# Patient Record
Sex: Female | Born: 1970
Health system: Southern US, Community
[De-identification: ages and names within clinical notes are randomized; demographics above are authoritative.]

## PROBLEM LIST (undated history)

## (undated) DIAGNOSIS — K8020 Calculus of gallbladder without cholecystitis without obstruction: Secondary | ICD-10-CM

## (undated) DIAGNOSIS — N920 Excessive and frequent menstruation with regular cycle: Secondary | ICD-10-CM

## (undated) HISTORY — DX: Excessive and frequent menstruation with regular cycle: N92.0

## (undated) HISTORY — PX: CHOLECYSTECTOMY: SHX55

## (undated) HISTORY — PX: LAPAROSCOPIC GASTRIC SLEEVE RESECTION: SHX5895

## (undated) HISTORY — PX: ENDOMETRIAL ABLATION: SHX621

## (undated) HISTORY — DX: Calculus of gallbladder without cholecystitis without obstruction: K80.20

## (undated) HISTORY — PX: ABDOMINAL HYSTERECTOMY: SHX81

---

## 2006-01-27 ENCOUNTER — Emergency Department: Payer: Self-pay | Admitting: Emergency Medicine

## 2006-01-27 ENCOUNTER — Other Ambulatory Visit: Payer: Self-pay

## 2008-04-18 ENCOUNTER — Emergency Department: Payer: Self-pay | Admitting: Emergency Medicine

## 2008-11-05 ENCOUNTER — Ambulatory Visit: Payer: Self-pay

## 2011-03-15 ENCOUNTER — Ambulatory Visit: Payer: Self-pay

## 2011-10-19 ENCOUNTER — Ambulatory Visit: Payer: Self-pay | Admitting: Bariatrics

## 2011-10-19 LAB — COMPREHENSIVE METABOLIC PANEL
Albumin: 3.8 g/dL (ref 3.4–5.0)
Alkaline Phosphatase: 73 U/L (ref 50–136)
Anion Gap: 9 (ref 7–16)
BUN: 11 mg/dL (ref 7–18)
Bilirubin,Total: 0.3 mg/dL (ref 0.2–1.0)
Calcium, Total: 9.2 mg/dL (ref 8.5–10.1)
Chloride: 105 mmol/L (ref 98–107)
Co2: 27 mmol/L (ref 21–32)
EGFR (Non-African Amer.): >60
Glucose: 94 mg/dL (ref 65–99)
Osmolality: 280 (ref 275–301)
Potassium: 4 mmol/L (ref 3.5–5.1)
SGOT(AST): 25 U/L (ref 15–37)
SGPT (ALT): 36 U/L
Sodium: 141 mmol/L (ref 136–145)

## 2011-10-19 LAB — CBC WITH DIFFERENTIAL/PLATELET
Basophil %: 0.9 %
Eosinophil #: 0.3 10*3/uL (ref 0.0–0.7)
HGB: 13.8 g/dL (ref 12.0–16.0)
Lymphocyte #: 1.8 10*3/uL (ref 1.0–3.6)
MCH: 28.3 pg (ref 26.0–34.0)
MCHC: 33.5 g/dL (ref 32.0–36.0)
Monocyte #: 0.6 x10 3/mm (ref 0.2–0.9)
Monocyte %: 10.2 %
Neutrophil #: 2.9 10*3/uL (ref 1.4–6.5)
RBC: 4.89 10*6/uL (ref 3.80–5.20)
WBC: 5.5 10*3/uL (ref 3.6–11.0)

## 2011-10-19 LAB — IRON AND TIBC
Iron Bind.Cap.(Total): 400 ug/dL (ref 250–450)
Iron Saturation: 11 %
Iron: 43 ug/dL — ABNORMAL LOW (ref 50–170)
Unbound Iron-Bind.Cap.: 357 ug/dL

## 2011-10-19 LAB — MAGNESIUM: Magnesium: 1.9 mg/dL

## 2011-10-19 LAB — LIPASE, BLOOD: Lipase: 246 U/L (ref 73–393)

## 2011-10-19 LAB — PROTIME-INR: INR: 0.9

## 2011-10-19 LAB — AMYLASE: Amylase: 42 U/L (ref 25–115)

## 2011-12-02 ENCOUNTER — Ambulatory Visit: Payer: Self-pay | Admitting: Bariatrics

## 2011-12-08 ENCOUNTER — Ambulatory Visit: Payer: Self-pay | Admitting: Bariatrics

## 2012-01-08 ENCOUNTER — Ambulatory Visit: Payer: Self-pay | Admitting: Bariatrics

## 2012-02-07 ENCOUNTER — Ambulatory Visit: Payer: Self-pay | Admitting: Bariatrics

## 2012-02-23 ENCOUNTER — Ambulatory Visit: Payer: Self-pay | Admitting: Bariatrics

## 2012-03-09 ENCOUNTER — Ambulatory Visit: Payer: Self-pay | Admitting: Bariatrics

## 2012-03-22 ENCOUNTER — Ambulatory Visit: Payer: Self-pay | Admitting: Bariatrics

## 2012-03-30 ENCOUNTER — Inpatient Hospital Stay: Payer: Self-pay | Admitting: Bariatrics

## 2012-03-31 LAB — CBC WITH DIFFERENTIAL/PLATELET
Basophil #: 0 10*3/uL (ref 0.0–0.1)
Basophil %: 0.4 %
Eosinophil #: 0 10*3/uL (ref 0.0–0.7)
Eosinophil %: 0.1 %
HCT: 38.8 % (ref 35.0–47.0)
HGB: 13 g/dL (ref 12.0–16.0)
Lymphocyte #: 1.6 10*3/uL (ref 1.0–3.6)
Lymphocyte %: 14.5 %
MCH: 28.3 pg (ref 26.0–34.0)
MCHC: 33.5 g/dL (ref 32.0–36.0)
MCV: 85 fL (ref 80–100)
Monocyte #: 1 x10 3/mm — ABNORMAL HIGH (ref 0.2–0.9)
Neutrophil #: 8.4 10*3/uL — ABNORMAL HIGH (ref 1.4–6.5)
RBC: 4.6 10*6/uL (ref 3.80–5.20)
WBC: 11.1 10*3/uL — ABNORMAL HIGH (ref 3.6–11.0)

## 2012-03-31 LAB — CREATININE, SERUM
Creatinine: 0.71 mg/dL (ref 0.60–1.30)
EGFR (African American): >60
EGFR (Non-African Amer.): >60

## 2012-04-02 LAB — PATHOLOGY REPORT

## 2012-04-17 ENCOUNTER — Ambulatory Visit: Payer: Self-pay

## 2012-04-26 ENCOUNTER — Ambulatory Visit: Payer: Self-pay | Admitting: Bariatrics

## 2012-05-09 ENCOUNTER — Ambulatory Visit: Payer: Self-pay | Admitting: Bariatrics

## 2012-08-14 ENCOUNTER — Other Ambulatory Visit: Payer: Self-pay | Admitting: Bariatrics

## 2012-08-14 LAB — COMPREHENSIVE METABOLIC PANEL
Albumin: 3.7 g/dL (ref 3.4–5.0)
Alkaline Phosphatase: 111 U/L (ref 50–136)
Anion Gap: 5 — ABNORMAL LOW (ref 7–16)
BUN: 10 mg/dL (ref 7–18)
EGFR (African American): >60
Potassium: 4.1 mmol/L (ref 3.5–5.1)
SGPT (ALT): 26 U/L (ref 12–78)
Sodium: 138 mmol/L (ref 136–145)

## 2012-08-14 LAB — PHOSPHORUS: Phosphorus: 3.1 mg/dL (ref 2.5–4.9)

## 2012-08-14 LAB — CBC WITH DIFFERENTIAL/PLATELET
Basophil #: 0 10*3/uL (ref 0.0–0.1)
Basophil %: 0.7 %
Eosinophil #: 0.2 10*3/uL (ref 0.0–0.7)
Eosinophil %: 2.7 %
HGB: 13.5 g/dL (ref 12.0–16.0)
Lymphocyte #: 1.5 10*3/uL (ref 1.0–3.6)
Lymphocyte %: 23.3 %
MCH: 28.7 pg (ref 26.0–34.0)
MCHC: 34.1 g/dL (ref 32.0–36.0)
MCV: 84 fL (ref 80–100)
Monocyte #: 0.6 x10 3/mm (ref 0.2–0.9)

## 2012-08-14 LAB — FOLATE: Folic Acid: 22.4 ng/mL (ref 3.1–100.0)

## 2012-08-14 LAB — IRON AND TIBC
Iron Bind.Cap.(Total): 362 ug/dL (ref 250–450)
Iron Saturation: 19 %
Iron: 69 ug/dL (ref 50–170)
Unbound Iron-Bind.Cap.: 293 ug/dL

## 2012-08-14 LAB — AMYLASE: Amylase: 42 U/L (ref 25–115)

## 2013-04-19 ENCOUNTER — Ambulatory Visit: Payer: Self-pay | Admitting: Family Medicine

## 2013-06-23 IMAGING — MG MM CAD SCREENING MAMMO
1 series · 4 of 4 positions shown · non-contrast
Comparison: none

REASON FOR EXAM: SCR MAMMO NO ORDER CAT 2
COMMENTS:

PROCEDURE:     MAM - MAM DGTL SCRN MAM NO ORDER W/CAD  - April 17, 2012  [DATE]
RESULT:

[R CC · right · 4 of 4 slices shown]
[im 1/4]
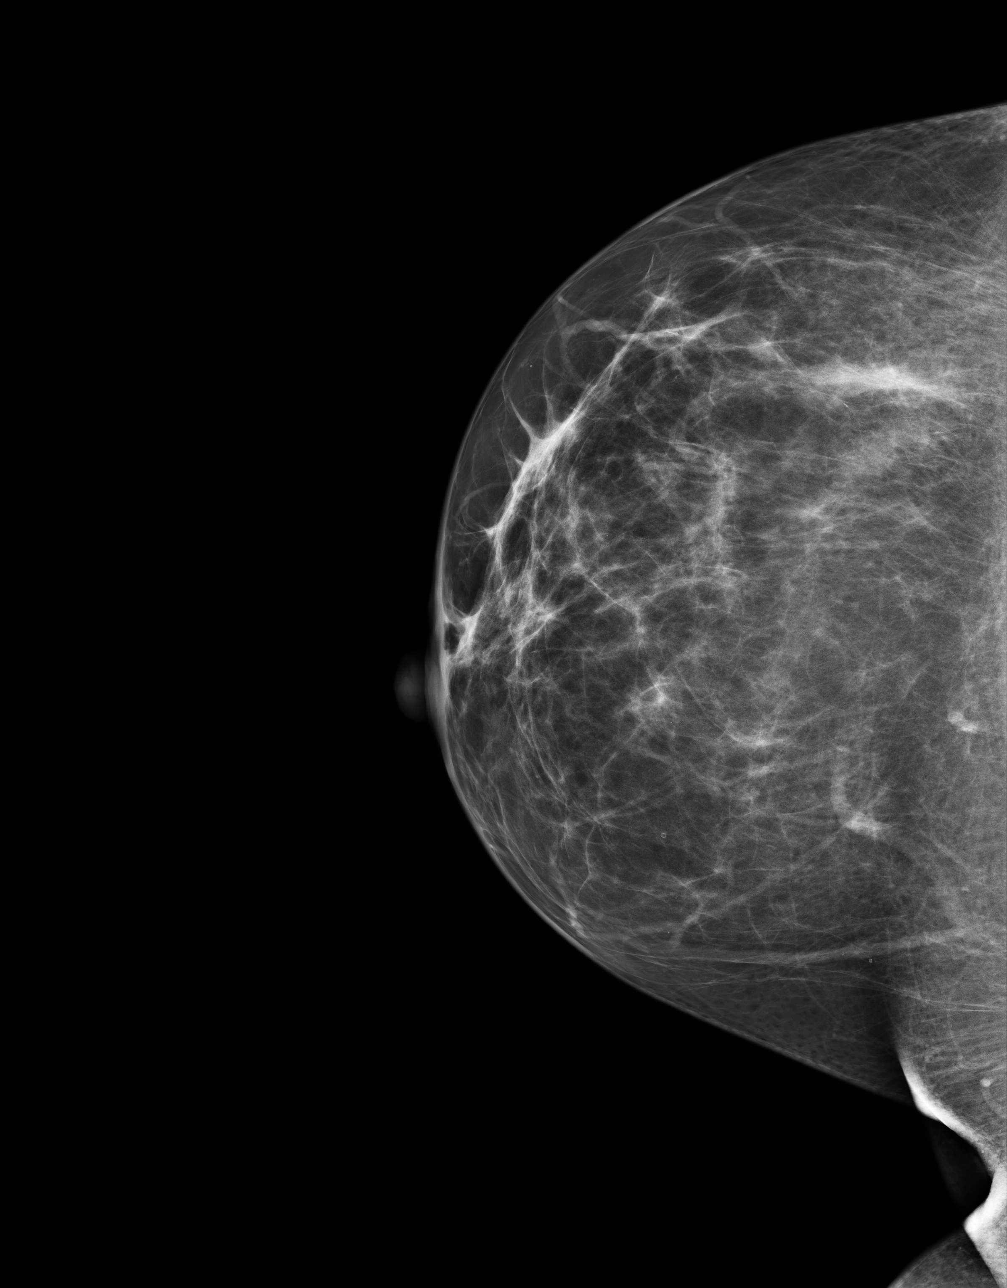
[im 2/4]
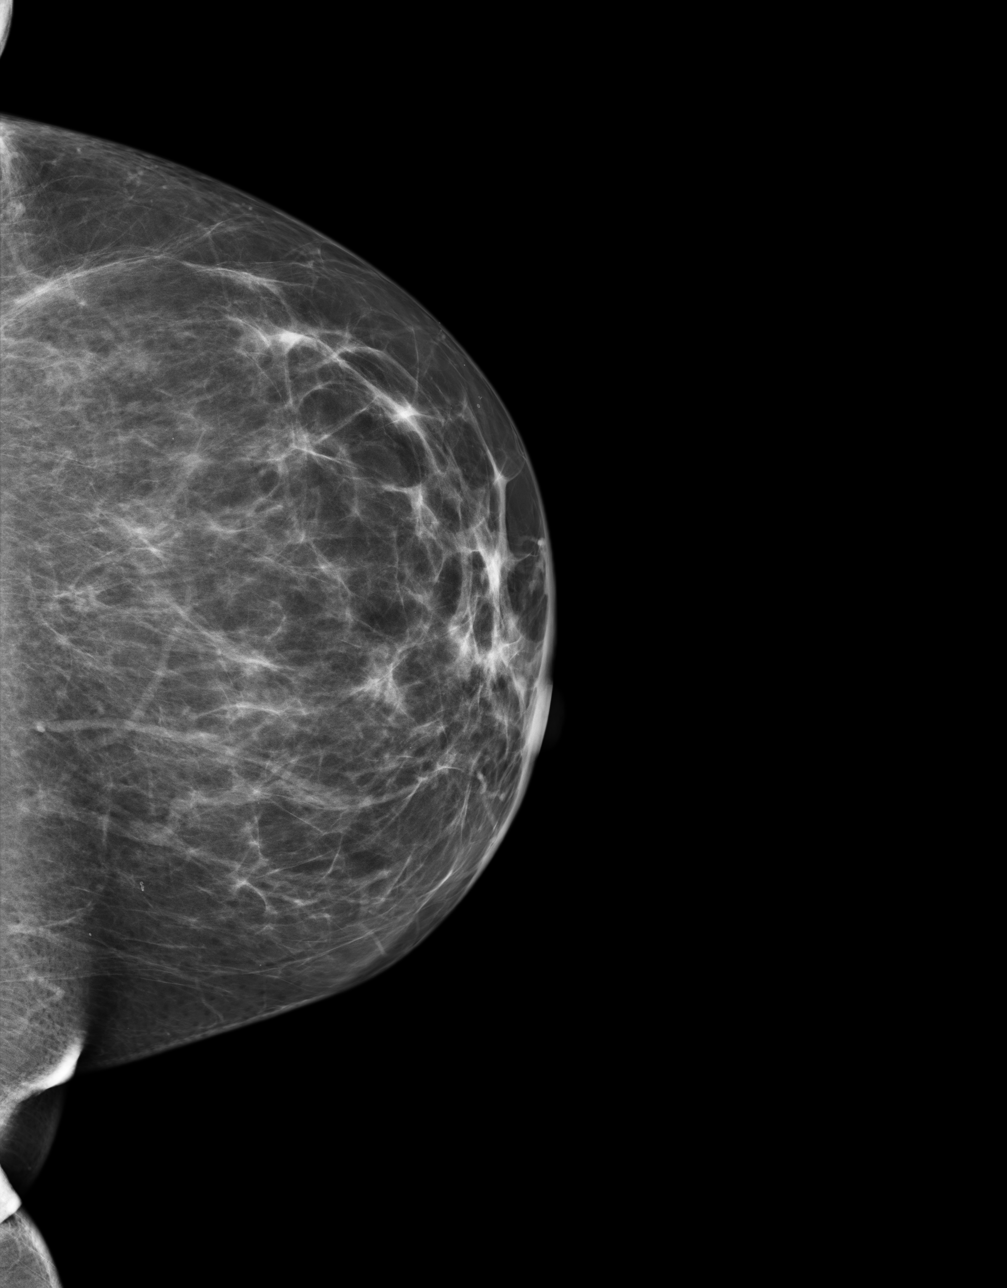
[im 3/4]
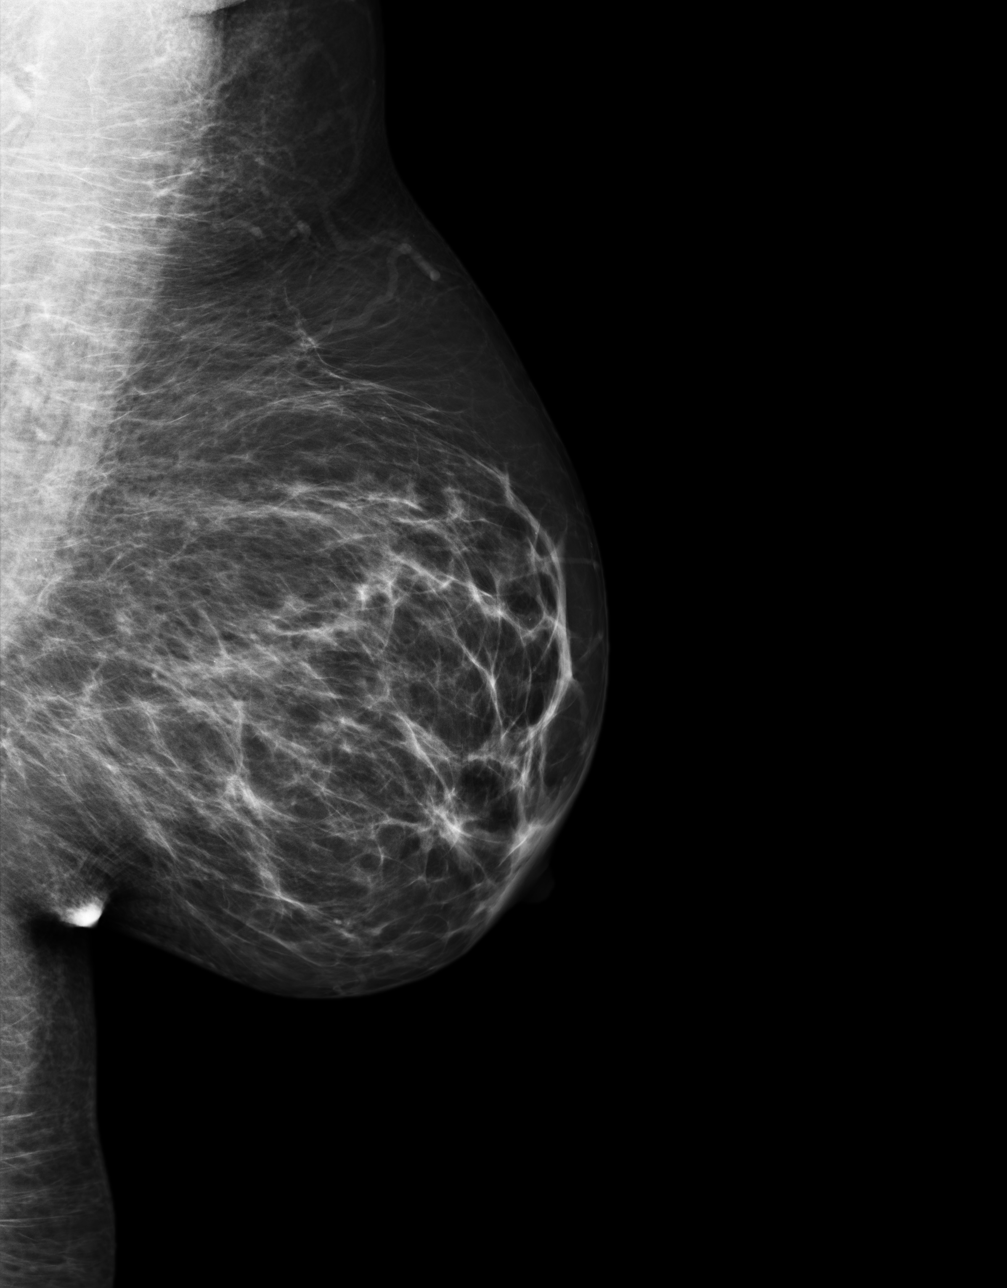
[im 4/4]
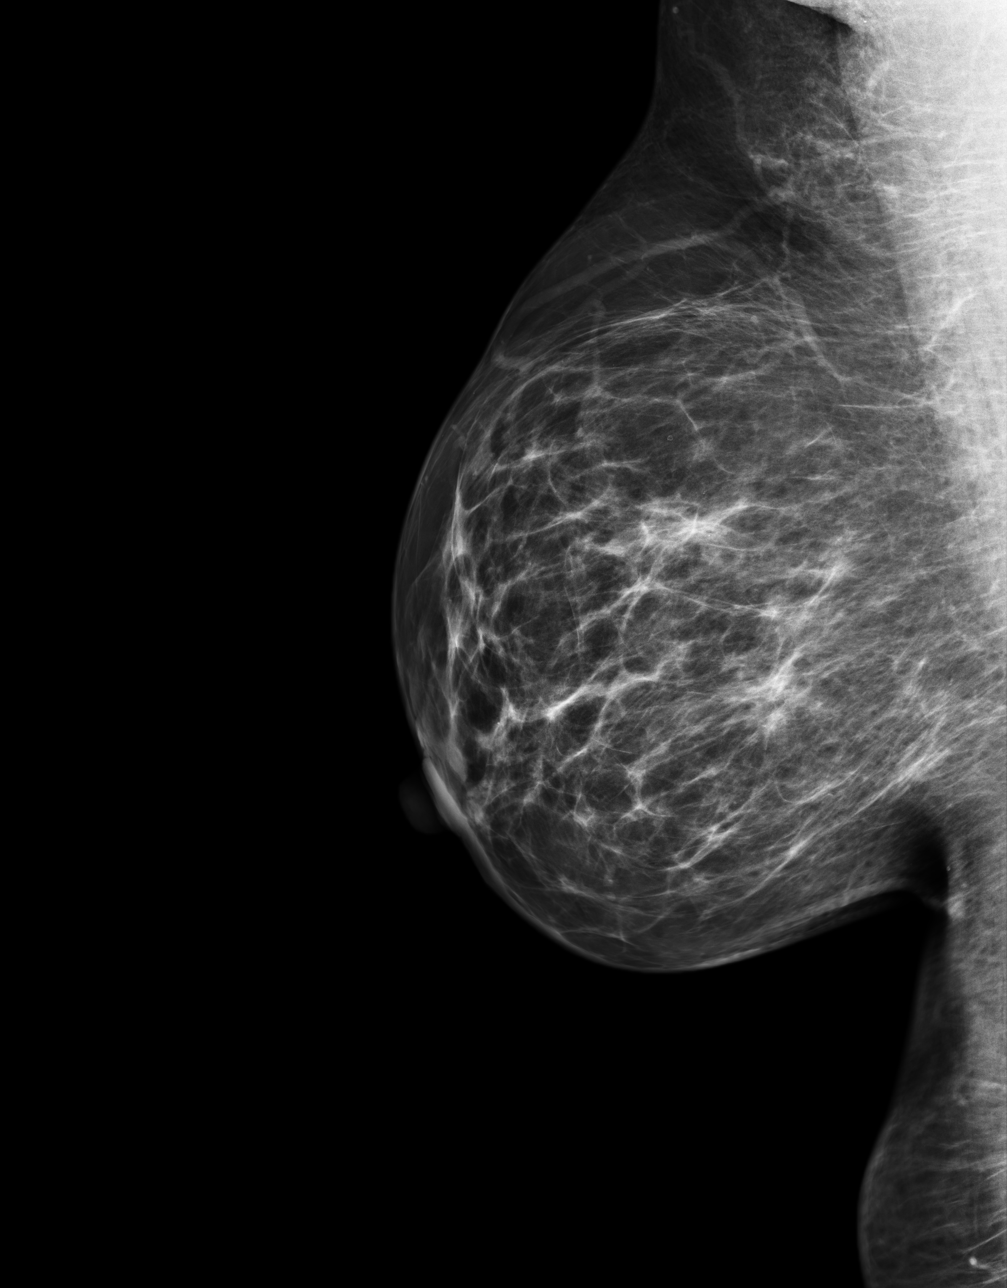

[4 of 4 positions shown; findings below may reference images not displayed]

FINDINGS: There is no family history or personal history of breast cancer.
The patient denies previous breast surgery.

Comparison is made to previous digital mammographic images dated 17 March, 2011.

Breast density: BI-RADS Type I

There is no dominant mass, malignant calcification or architectural
distortion. Scattered fibroglandular elements are present. There is no
significant interval change.
IMPRESSION: Stable, benign appearing bilateral mammogram.

BI-RADS: Category 2 - Benign Findings

RECOMMENDATION:  Please continue to encourage annual mammographic follow-up.

[REDACTED]

Thank you for this opportunity to contribute to the care of your patient.

A NEGATIVE MAMMOGRAM REPORT DOES NOT PRECLUDE BIOPSY OR OTHER EVALUATION OF
A CLINICALLY PALPABLE OR OTHERWISE SUSPICIOUS MASS OR LESION. BREAST CANCER
MAY NOT BE DETECTED BY MAMMOGRAPHY IN UP TO 10% OF CASES.

## 2014-04-25 ENCOUNTER — Ambulatory Visit: Payer: Self-pay | Admitting: Nurse Practitioner

## 2014-04-28 ENCOUNTER — Ambulatory Visit: Payer: Self-pay | Admitting: Family Medicine

## 2014-04-28 LAB — COMPREHENSIVE METABOLIC PANEL
ALT: 22 U/L
AST: 15 U/L (ref 15–37)
Albumin: 4 g/dL (ref 3.4–5.0)
Alkaline Phosphatase: 64 U/L
Anion Gap: 5 — ABNORMAL LOW (ref 7–16)
BILIRUBIN TOTAL: 0.4 mg/dL (ref 0.2–1.0)
BUN: 9 mg/dL (ref 7–18)
Calcium, Total: 9 mg/dL (ref 8.5–10.1)
Chloride: 107 mmol/L (ref 98–107)
Co2: 28 mmol/L (ref 21–32)
Creatinine: 0.87 mg/dL (ref 0.60–1.30)
EGFR (Non-African Amer.): >60
Glucose: 90 mg/dL (ref 65–99)
OSMOLALITY: 278 (ref 275–301)
Potassium: 4.1 mmol/L (ref 3.5–5.1)
SODIUM: 140 mmol/L (ref 136–145)
Total Protein: 7.2 g/dL (ref 6.4–8.2)

## 2014-04-28 LAB — CBC WITH DIFFERENTIAL/PLATELET
BASOS PCT: 1.1 %
Basophil #: 0.1 10*3/uL (ref 0.0–0.1)
EOS PCT: 4.9 %
Eosinophil #: 0.2 10*3/uL (ref 0.0–0.7)
HCT: 41.7 % (ref 35.0–47.0)
HGB: 13.8 g/dL (ref 12.0–16.0)
Lymphocyte #: 1.7 10*3/uL (ref 1.0–3.6)
Lymphocyte %: 33.2 %
MCH: 29.6 pg (ref 26.0–34.0)
MCHC: 33.1 g/dL (ref 32.0–36.0)
MCV: 89 fL (ref 80–100)
Monocyte #: 0.6 x10 3/mm (ref 0.2–0.9)
Monocyte %: 11.4 %
Neutrophil #: 2.5 10*3/uL (ref 1.4–6.5)
Neutrophil %: 49.4 %
Platelet: 234 10*3/uL (ref 150–440)
RBC: 4.66 10*6/uL (ref 3.80–5.20)
RDW: 13.1 % (ref 11.5–14.5)
WBC: 5.1 10*3/uL (ref 3.6–11.0)

## 2014-05-09 DIAGNOSIS — K8020 Calculus of gallbladder without cholecystitis without obstruction: Secondary | ICD-10-CM

## 2014-05-09 HISTORY — DX: Calculus of gallbladder without cholecystitis without obstruction: K80.20

## 2014-06-09 ENCOUNTER — Ambulatory Visit: Payer: Self-pay | Admitting: Obstetrics and Gynecology

## 2014-06-09 HISTORY — PX: NOVASURE ABLATION: SHX5394

## 2014-06-09 LAB — HEMOGLOBIN: HGB: 13.8 g/dL (ref 12.0–16.0)

## 2014-06-13 ENCOUNTER — Ambulatory Visit: Payer: Self-pay | Admitting: Surgery

## 2014-08-26 NOTE — Op Note (Signed)
PATIENT NAME:  Lauren Anthony, Lauren Anthony MR#:  [id number] DATE OF BIRTH:  1970-07-11  DATE OF PROCEDURE:  03/30/2012  PREOPERATIVE DIAGNOSIS: Morbid obesity associated with hypertension.    POSTOPERATIVE DIAGNOSIS: Morbid obesity associated with hypertension.    PROCEDURE PERFORMED: Laparoscopic sleeve gastrectomy.   SURGEON: Venia Carbon. Duke Salvia, MD  SPECIMEN REMOVED: Lateral stomach.   DESCRIPTION OF PROCEDURE: The patient was brought to the Operating Room and placed in the supine position and general anesthesia was obtained with orotracheal intubation. The patient had a Foley catheter inserted under sterile technique. TED hose and Thromboguards were applied and foot board placed at the end of the operative bed. The abdomen and lower chest were sterilely prepped and draped. A small incision was made in the left upper quadrant of the abdomen and a 5 mm trocar introduced in the abdominal cavity, as confirmed by saline drop test. Pneumoperitoneum was obtained with carbon dioxide. The patient then had three additional trocars introduced across the upper abdomen and a Nathanson liver retractor introduced through a subxiphoid wound and used to elevate the left lobe of the liver. On inspection there was no evidence of hiatal hernia appreciated. There was a generous fat pad in the region of the GE junction. This was partially excised to improve exposure of the angle of His. The patient also had additional diaphragmatic attachments between the upper fundus and the undersurface of the left hemidiaphragm. These were divided by use of the Harmonic scalpel. The patient then underwent transection of multiple gastric pedicles along the greater curvature of the stomach beginning approximately 4 cm proximal to the pylorus. This dissection was extended with division of multiple gastrocolic ligament and gastrosplenic ligament branches. The patient had scattered peritoneal attachments to the anterior aspect of the pancreas and these  were divided as well. With full mobilization of the lateral stomach achieved, a 34 French bougie was passed transorally and directed into the level of the antrum. Next, a series of GI staplers were used to complete a lateral resection of the stomach leaving a medially based sleeve effect. The first firing was a green load staple placed in relative transverse direction with an effort to avoid any narrowing in the region of the incisura. Next, a series of vertically oriented staples were placed parallel to the lesser curvature and brought out just lateral to the angle of His. There was a smooth transition from staple line to staple line. A small dog ear was left intact, at the region of the angle of His. At this point, the lateral stomach was retrieved through the 15 mm trocar deficit, in the right upper quadrant of the abdomen. Prior divided portions of the gastrosplenic and colic ligament were then secured to the lateral margins of sleeve with interrupted Ethibond suture. This was done to prevent any flipping or rolling of the gastric sleeve. Following confirmed hemostasis, the trocars were removed and the fascia and peritoneum of the 15 mm trocar site was closed by way of two 0-Vicryl sutures passed under direct visualization with a needle suture passing system. This was done prior to resolution of the pneumoperitoneum. The trocars were removed and the wounds injected with 0.25% Marcaine followed by 4-0 Monocryl in the dermis followed by Dermabond. The patient was allowed to recover having tolerated the procedure well. There was minimal blood loss.  ____________________________ Venia Carbon Duke Salvia, MD mat:slb D: 03/31/2012 14:34:00 ET T: 04/01/2012 11:38:25 ET JOB#: [id number]  cc: Legrand Como A. Duke Salvia, MD, <Dictator> Arlis Porta., MD Rainee Sweatt A  Duke Salvia MD ELECTRONICALLY SIGNED 04/11/2012 8:43

## 2014-09-01 LAB — SURGICAL PATHOLOGY

## 2014-09-07 NOTE — Op Note (Signed)
PATIENT NAME:  Lauren Anthony, Lauren Anthony MR#:  [id number] DATE OF BIRTH:  Sep 12, 1970  DATE OF PROCEDURE:  06/13/2014  PREOPERATIVE DIAGNOSES: 1. Menorrhagia.  2. Leiomyoma.  3. Dysmenorrhea.   POSTOPERATIVE DIAGNOSES:   1. Menorrhagia.  2. Leiomyoma.  3. Dysmenorrhea.   OPERATION: Hysteroscopy, Dilation and Curettage with NovaSure endometrial ablation.   ANESTHESIA: General.   SURGEON: Thalya Fouche S. Marcelline Mates, MD   ESTIMATED BLOOD LOSS: 10 mL.  OPERATIVE FLUIDS: 1200 mL.   URINE OUTPUT: 400 mL.   COMPLICATIONS: None.   FINDINGS: On exam under anesthesia, the uterus was approximately 10 weeks' size, slightly mobile with minimal descensus. Uterus sounded to 10 cm. Cervical length was 4 cm and cavity width was 4.1 cm. On hysteroscopy, there was noted to be a moderately proliferative endometrium. The tubal ostia were identified bilaterally. There was adequate charring of the tissues after the endometrial ablation had been performed.   SPECIMEN: Endometrial curettings.   CONDITION: Stable.   Of note, the patient also had laparoscopic cholecystectomy performed with Dr. Francisco Capuchin just prior to current procedure being performed while the patient was still under general anesthesia.   PROCEDURE IN DETAIL: The patient was taken to the operating room, where she was placed under general anesthesia without difficulty. She was then prepped and draped in normal sterile fashion. She underwent a laparoscopic cholecystectomy performed by Dr. Nicholes Stairs. Please see his procedure note for details of procedure. After procedure was completed, the patient was reprepped and draped again in normal sterile fashion for current procedure, maintaining sterility.   Next, a straight catheterization was performed with return of 400 mL of clear urine. Next, a univalve speculum was placed into the vagina. The anterior lip of the cervix was grasped with a single-tooth tenaculum. The uterus was then sounded to 10 cm. The cervix was  then gradually dilated.   Next, the hysteroscope was introduced into the cavity, and a survey of the cavity was performed with the above findings noted. The hysteroscope was then removed from the uterine cavity and a sharp curettage was performed until a gritty texture was noted. Endometrial curettings were then sent to pathology. After the hysteroscope had been removed, the NovaSure ablation device was then primed and inserted into the uterine cavity. After appropriate measurement had been obtained, with cavity length being 6 cm and cavity width being 4.1 cm, the NovaSure device was activated with a total ablation time of 1 minute and 58 seconds. The NovaSure device was then removed from the uterine cavity. The hysteroscope was then reintroduced into the cavity with adequate charring of the tissue noted. The hysteroscope was removed from the patient's cavity. The single-tooth tenaculum was removed from the anterior lip of the cervix, and the speculum was removed from the patient's vagina. All instrument and sponge counts were correct at the end of the procedure. The patient was awakened and sent to the PACU in stable condition.    ____________________________ Lauren Noon Marcelline Mates, MD asc:mw D: 06/13/2014 10:32:30 ET T: 06/13/2014 12:43:00 ET JOB#: [id number]  cc: Lauren Noon. Marcelline Mates, MD, <Dictator> Augusto Gamble MD ELECTRONICALLY SIGNED 07/04/2014 14:44

## 2014-09-07 NOTE — Op Note (Signed)
PATIENT NAME:  Lauren Anthony, Lauren Anthony MR#:  [id number] DATE OF BIRTH:  23-Aug-1970  DATE OF PROCEDURE:  06/13/2014  PREOPERATIVE DIAGNOSES: Chronic cholecystitis, cholelithiasis.   POSTOPERATIVE DIAGNOSES:  Chronic cholecystitis, cholelithiasis.   PROCEDURE: Laparoscopic cholecystectomy.   SURGEON: Loreli Dollar, MD   ANESTHESIA: General.   INDICATIONS: This 44 year old female has a history of severe epigastric pain and ultrasound findings of gallstones, and surgery was recommended for definitive treatment.   DESCRIPTION OF PROCEDURE: The patient was placed on the operating table in the supine position under general endotracheal anesthesia. The abdomen was prepared with ChloraPrep and draped in a sterile manner.   A short incision was made in the inferior aspect of the umbilicus and carried down to the deep fascia, which was grasped with a laryngeal hook and elevated. A Veress needle was inserted, aspirated, and irrigated with a saline solution. Next, the peritoneal cavity was inflated with carbon dioxide. The Veress needle was removed. The 10 mm cannula was inserted. The 10 mm, 0 degrees laparoscope was inserted to view the peritoneal cavity. Brief survey revealed smooth typical appearance of the liver. There were staples identified in the greater curvature of the remaining stomach after she has had sleeve gastrectomy. There were a number of adhesions between the omentum and the lower abdominal wall. Other visible intestines appeared typical.   Another incision was made in the epigastrium, slightly to the right of the midline, to introduce an 11 mm cannula. Two incisions were made in the lateral aspect of the right upper quadrant to introduce two 5 mm cannulas.   The gallbladder was retracted towards the right shoulder. A number of adhesions were taken down with blunt dissection. The infundibulum was retracted inferiorly and laterally. The gallbladder neck was mobilized with incision of the visceral  peritoneum. The cystic duct and cystic artery were dissected free from surrounding structures. I noted the location of the porta hepatis. A critical view of safety was demonstrated. An Endo Clip was placed across the cystic duct adjacent to the neck of the gallbladder. An incision was made in the cystic duct to introduce a Reddick catheter; however, the catheter would not thread. This appeared to be due to the small size of the cystic duct. The Reddick catheter was removed. The cystic duct was doubly ligated with Endo Clips and divided. The cystic artery was controlled with double Endo Clip and divided. One other branch of the cystic artery was controlled with Endo Clip and divided. The gallbladder was dissected free from the liver with hook and cautery. Bleeding was minimal. Hemostasis was subsequently intact. The gallbladder was brought out through the infraumbilical incision, opened and suctioned. Multiple stones were removed with the stone forceps and the stone scoop. The gallbladder was completely removed and submitted in formalin with stones for routine pathology. The right upper quadrant was further inspected and could see hemostasis was intact. Next, the cannulas were removed, allowing carbon dioxide to escape from the peritoneal cavity. There was a bleeding point at the lower most right upper quadrant incision, and this was treated with electrocautery and also infiltrated all incisions with 0.5% Sensorcaine with epinephrine. Next, the incisions were closed with interrupted 5-0 chromic subcuticular suture, benzoin, and Steri-Strips. Dressings were applied with paper tape. The patient appeared to be in satisfactory condition and remained on the operating table for Dr. Marcelline Mates to do uterine ablation.     ____________________________ Lauren Anthony. Rochel Brome, MD jws:mw D: 06/13/2014 09:22:30 ET T: 06/13/2014 11:47:10 ET JOB#: [id number]  cc: Loreli Dollar, MD, <Dictator> Loreli Dollar MD ELECTRONICALLY SIGNED  06/19/2014 15:20

## 2014-11-18 ENCOUNTER — Other Ambulatory Visit: Payer: Self-pay | Admitting: Obstetrics and Gynecology

## 2014-11-18 DIAGNOSIS — Z13220 Encounter for screening for lipoid disorders: Secondary | ICD-10-CM

## 2014-11-18 DIAGNOSIS — R102 Pelvic and perineal pain: Secondary | ICD-10-CM

## 2014-11-18 DIAGNOSIS — Z131 Encounter for screening for diabetes mellitus: Secondary | ICD-10-CM

## 2014-11-18 NOTE — Progress Notes (Signed)
Patient complains of left sided pelvic pain over past 2 months.  Is s/p endometrial ablation within past year.  Still notes periods, but less frequent and very light. Also notes that she was seen by PCP several months ago but no labs were drawn.  Desires to have screening bloodwork done at [facility] at same time as ultrasound.  Will order.

## 2014-11-19 ENCOUNTER — Telehealth: Payer: Self-pay | Admitting: Obstetrics and Gynecology

## 2014-11-19 ENCOUNTER — Other Ambulatory Visit: Payer: Self-pay | Admitting: Family

## 2014-11-19 ENCOUNTER — Ambulatory Visit
Admission: RE | Admit: 2014-11-19 | Discharge: 2014-11-19 | Disposition: A | Discharge status: Home / Self Care | Payer: 59 | Source: Ambulatory Visit | Attending: Family | Admitting: Family

## 2014-11-19 DIAGNOSIS — R102 Pelvic and perineal pain: Secondary | ICD-10-CM

## 2014-11-19 DIAGNOSIS — N858 Other specified noninflammatory disorders of uterus: Secondary | ICD-10-CM | POA: Diagnosis not present

## 2014-11-19 DIAGNOSIS — R1032 Left lower quadrant pain: Secondary | ICD-10-CM | POA: Diagnosis present

## 2014-11-19 DIAGNOSIS — N8000 Endometriosis of the uterus, unspecified: Secondary | ICD-10-CM

## 2014-11-19 DIAGNOSIS — D259 Leiomyoma of uterus, unspecified: Secondary | ICD-10-CM | POA: Insufficient documentation

## 2014-11-19 NOTE — Telephone Encounter (Signed)
Lauren Anthony CALLED AND JUST WANTED TO REMIND YOU TO LOOK AT HER [place] SHE HAD THIS MORNING. :)

## 2014-11-20 ENCOUNTER — Other Ambulatory Visit: Payer: Self-pay

## 2014-11-20 ENCOUNTER — Telehealth: Payer: Self-pay | Admitting: Obstetrics and Gynecology

## 2014-11-20 ENCOUNTER — Other Ambulatory Visit: Payer: 59

## 2014-11-20 DIAGNOSIS — R102 Pelvic and perineal pain: Secondary | ICD-10-CM

## 2014-11-20 DIAGNOSIS — Z13220 Encounter for screening for lipoid disorders: Secondary | ICD-10-CM

## 2014-11-20 DIAGNOSIS — R5383 Other fatigue: Secondary | ICD-10-CM

## 2014-11-20 DIAGNOSIS — Z131 Encounter for screening for diabetes mellitus: Secondary | ICD-10-CM

## 2014-11-20 MED ORDER — NORETHINDRONE 0.35 MG PO TABS: 1.0000 | ORAL_TABLET | Freq: Every day | ORAL | Status: DC

## 2014-11-20 MED ORDER — HYDROCODONE-ACETAMINOPHEN 5-325 MG PO TABS: 1.0000 | ORAL_TABLET | Freq: Four times a day (QID) | ORAL | Status: DC | PRN

## 2014-11-20 NOTE — Telephone Encounter (Signed)
[facility] went to [facility] due to pain. Discussion had with patient regarding results of ultrasound, performed for new onset left pelvic pain x 3 days.  Notes that pain is now radiating to back.  No evidence of ovarian mass.  Small 1 cm posterior fibroid.  Two small cystic areas 1 cm each near uterine fundus (possibly post-ablation effects vs adenomyosis).     Patient to come in tomorrow for lab work, will also check for UTI.  Will also prescribe 1 month trial of progesterone OCPs and give Vicodin for pain (patient currently taking up to 1000 mg of Tylenol, cannot take NSAIDs due to GI history). Has appointment scheduled on 12/05/14 to discuss other possible management, but patient was desiring something temporary as she is going on vacation next week.   Rubie Maid, MD [facility] 11/20/2014 9:22 AM

## 2014-11-20 NOTE — Telephone Encounter (Signed)
CALLING FOR LAB RESULTS, ESPECIALLY URINE TEST

## 2014-11-21 ENCOUNTER — Telehealth: Payer: Self-pay

## 2014-11-21 ENCOUNTER — Other Ambulatory Visit: Payer: Self-pay | Admitting: Obstetrics and Gynecology

## 2014-11-21 DIAGNOSIS — R319 Hematuria, unspecified: Secondary | ICD-10-CM

## 2014-11-21 LAB — BASIC METABOLIC PANEL
BUN/Creatinine Ratio: 15 (ref 9–23)
BUN: 12 mg/dL (ref 6–24)
CHLORIDE: 103 mmol/L (ref 97–108)
CO2: 22 mmol/L (ref 18–29)
Calcium: 9.6 mg/dL (ref 8.7–10.2)
Creatinine, Ser: 0.82 mg/dL (ref 0.57–1.00)
GFR, EST AFRICAN AMERICAN: 101 mL/min/{1.73_m2} (ref >59)
GFR, EST NON AFRICAN AMERICAN: 88 mL/min/{1.73_m2} (ref >59)
Glucose: 87 mg/dL (ref 65–99)
POTASSIUM: 4.3 mmol/L (ref 3.5–5.2)
Sodium: 142 mmol/L (ref 134–144)

## 2014-11-21 LAB — URINALYSIS, ROUTINE W REFLEX MICROSCOPIC
Bilirubin, UA: NEGATIVE
GLUCOSE, UA: NEGATIVE
Ketones, UA: NEGATIVE
Leukocytes, UA: NEGATIVE
Nitrite, UA: NEGATIVE
PH UA: 6.5 (ref 5.0–7.5)
PROTEIN UA: NEGATIVE
Specific Gravity, UA: 1.017 (ref 1.005–1.030)
UUROB: 1 mg/dL (ref 0.2–1.0)

## 2014-11-21 LAB — LIPID PANEL
CHOL/HDL RATIO: 3.7 ratio (ref 0.0–4.4)
Cholesterol, Total: 168 mg/dL (ref 100–199)
HDL: 45 mg/dL (ref >39)
LDL CALC: 89 mg/dL (ref 0–99)
TRIGLYCERIDES: 171 mg/dL — AB (ref 0–149)
VLDL CHOLESTEROL CAL: 34 mg/dL (ref 5–40)

## 2014-11-21 LAB — CBC
HEMATOCRIT: 42.7 % (ref 34.0–46.6)
HEMOGLOBIN: 14.3 g/dL (ref 11.1–15.9)
MCH: 29.7 pg (ref 26.6–33.0)
MCHC: 33.5 g/dL (ref 31.5–35.7)
MCV: 89 fL (ref 79–97)
Platelets: 258 10*3/uL (ref 150–379)
RBC: 4.82 x10E6/uL (ref 3.77–5.28)
RDW: 13.5 % (ref 12.3–15.4)
WBC: 5.9 10*3/uL (ref 3.4–10.8)

## 2014-11-21 LAB — TSH: TSH: 1.62 u[IU]/mL (ref 0.450–4.500)

## 2014-11-21 LAB — MICROSCOPIC EXAMINATION: CASTS: NONE SEEN /LPF

## 2014-11-21 LAB — URINALYSIS

## 2014-11-21 LAB — SPECIMEN STATUS REPORT

## 2014-11-21 MED ORDER — NITROFURANTOIN MONOHYD MACRO 100 MG PO CAPS: 100.0000 mg | ORAL_CAPSULE | Freq: Two times a day (BID) | ORAL | Status: DC

## 2014-11-21 NOTE — Telephone Encounter (Signed)
Pt informed of information below. New urine specimen already obtained.

## 2014-11-21 NOTE — Telephone Encounter (Signed)
Spoke with pt informed her that her stat UA did show hematuria (2+) however was otherwise negative. Advised pt that I would send urine for a culture however that would take several days as the bacteria needs to grow. Pt states that she is having pain with urination and would like to begin treatment. Advised pt that I would send in an antibiotic for her (Macrobid) however we may need to change this medication depending upon what the culture shows. Pt gave verbal agreement. RX sent. Culture ordered.

## 2014-11-21 NOTE — Telephone Encounter (Signed)
-----   Message from Rubie Maid, MD sent at 11/21/2014 12:10 PM EDT ----- Please inform patient of normal screening labs except for mildly elevated triglycerides.  Also, urine sent to [facility], noted that a suitable specimen was obtained.  Needed repeat.  Can run as a POCT if patient can come in sometime today.

## 2014-11-21 NOTE — Telephone Encounter (Signed)
Lab Amy called pt and informed her that there was an issue with the initial urine sample that was obtained and informed pt that we need her to come back by the office and give another sample. Pt gave verbal understanding and sample was obtained.

## 2014-11-22 LAB — VITAMIN D 25 HYDROXY (VIT D DEFICIENCY, FRACTURES): VIT D 25 HYDROXY: 37.3 ng/mL (ref 30.0–100.0)

## 2014-11-22 LAB — SPECIMEN STATUS REPORT

## 2014-11-26 NOTE — Telephone Encounter (Signed)
Pt informed of UTI, and the need to complete Macrobid.

## 2014-11-26 NOTE — Telephone Encounter (Signed)
-----   Message from Rubie Maid, MD sent at 11/26/2014  9:53 AM EDT ----- Please inform patient of E. Coli noted on Urine culture.  Can continue taking Macrobid as prescribed.

## 2014-11-27 LAB — SPECIMEN STATUS REPORT

## 2014-11-27 LAB — URINE CULTURE

## 2014-12-02 ENCOUNTER — Ambulatory Visit: Payer: Self-pay | Admitting: Obstetrics and Gynecology

## 2014-12-05 ENCOUNTER — Telehealth: Payer: Self-pay | Admitting: Obstetrics and Gynecology

## 2014-12-05 ENCOUNTER — Other Ambulatory Visit (INDEPENDENT_AMBULATORY_CARE_PROVIDER_SITE_OTHER): Payer: 59 | Admitting: Obstetrics and Gynecology

## 2014-12-05 ENCOUNTER — Other Ambulatory Visit: Payer: Self-pay

## 2014-12-05 VITALS — BP 137/81 | HR 85 | Temp 98.3°F | Ht 63.0 in | Wt 181.7 lb

## 2014-12-05 DIAGNOSIS — R350 Frequency of micturition: Secondary | ICD-10-CM | POA: Diagnosis not present

## 2014-12-05 DIAGNOSIS — R3915 Urgency of urination: Secondary | ICD-10-CM | POA: Diagnosis not present

## 2014-12-05 LAB — POCT URINALYSIS DIPSTICK
Bilirubin, UA: NEGATIVE
Glucose, UA: NEGATIVE
Ketones, UA: NEGATIVE
Leukocytes, UA: NEGATIVE
NITRITE UA: NEGATIVE
PH UA: 6
Protein, UA: NEGATIVE
SPEC GRAV UA: 1.01
Urobilinogen, UA: 0.2

## 2014-12-05 NOTE — Telephone Encounter (Signed)
Pt advised to come by office for nurse visit. RF advised to put her on nurse schedule.

## 2014-12-05 NOTE — Telephone Encounter (Signed)
Patient is still having pain from uti. She was given macrobid by Dr Marcelline Mates and felt a little better this past week, however pain has returned like before treatment with macrobid. Patient didn't know if she needed something stronger. Thanks

## 2014-12-05 NOTE — Progress Notes (Unsigned)
Pt give Macrobid for UTI 7/15. Completed meds on 7/27. Last nite she was having urgency, frequency, and pressure. Having flank pain. NO fevers. She took a Vicodin last nite which helped with the pain. This am she took 1,000 of tylenol and pain is there but not as intense. U/A- trace non-hemolyzed blood only. Will send for culture. Pt advised if she develops fever, n/v, or severe flank pain she will need to go to ER. Aware culture will be back on Monday. Advised to contact office for results.

## 2014-12-06 LAB — URINE CULTURE: ORGANISM ID, BACTERIA: NO GROWTH

## 2014-12-07 ENCOUNTER — Emergency Department
Admission: EM | Admit: 2014-12-07 | Discharge: 2014-12-07 | Disposition: A | Discharge status: Home / Self Care | Payer: 59 | Attending: Emergency Medicine | Admitting: Emergency Medicine

## 2014-12-07 ENCOUNTER — Emergency Department: Payer: 59

## 2014-12-07 ENCOUNTER — Encounter: Payer: Self-pay | Admitting: Emergency Medicine

## 2014-12-07 DIAGNOSIS — R1031 Right lower quadrant pain: Secondary | ICD-10-CM | POA: Diagnosis not present

## 2014-12-07 DIAGNOSIS — Z3202 Encounter for pregnancy test, result negative: Secondary | ICD-10-CM | POA: Diagnosis not present

## 2014-12-07 LAB — COMPREHENSIVE METABOLIC PANEL
ALT: 21 U/L (ref 14–54)
AST: 24 U/L (ref 15–41)
Albumin: 4.6 g/dL (ref 3.5–5.0)
Alkaline Phosphatase: 57 U/L (ref 38–126)
Anion gap: 8 (ref 5–15)
BUN: 11 mg/dL (ref 6–20)
CALCIUM: 10.1 mg/dL (ref 8.9–10.3)
CO2: 25 mmol/L (ref 22–32)
Chloride: 107 mmol/L (ref 101–111)
Creatinine, Ser: 0.69 mg/dL (ref 0.44–1.00)
GFR calc non Af Amer: >60 mL/min (ref >60)
Glucose, Bld: 90 mg/dL (ref 65–99)
POTASSIUM: 4.2 mmol/L (ref 3.5–5.1)
Sodium: 140 mmol/L (ref 135–145)
TOTAL PROTEIN: 8.1 g/dL (ref 6.5–8.1)
Total Bilirubin: 0.6 mg/dL (ref 0.3–1.2)

## 2014-12-07 LAB — CBC
HEMATOCRIT: 40.2 % (ref 35.0–47.0)
Hemoglobin: 13.5 g/dL (ref 12.0–16.0)
MCH: 29 pg (ref 26.0–34.0)
MCHC: 33.5 g/dL (ref 32.0–36.0)
MCV: 86.6 fL (ref 80.0–100.0)
Platelets: 234 10*3/uL (ref 150–440)
RBC: 4.64 MIL/uL (ref 3.80–5.20)
RDW: 13.5 % (ref 11.5–14.5)
WBC: 6.8 10*3/uL (ref 3.6–11.0)

## 2014-12-07 LAB — URINALYSIS COMPLETE WITH MICROSCOPIC (ARMC ONLY)
BILIRUBIN URINE: NEGATIVE
GLUCOSE, UA: NEGATIVE mg/dL
Hgb urine dipstick: NEGATIVE
Ketones, ur: NEGATIVE mg/dL
LEUKOCYTES UA: NEGATIVE
NITRITE: NEGATIVE
Protein, ur: NEGATIVE mg/dL
Specific Gravity, Urine: 1.012 (ref 1.005–1.030)
pH: 5 (ref 5.0–8.0)

## 2014-12-07 MED ORDER — IOHEXOL 300 MG/ML  SOLN
100.0000 mL | Freq: Once | INTRAMUSCULAR | Status: AC | PRN
  Administered 2014-12-07: 100 mL via INTRAVENOUS

## 2014-12-07 MED ORDER — IOHEXOL 240 MG/ML SOLN
25.0000 mL | Freq: Once | INTRAMUSCULAR | Status: AC | PRN
  Administered 2014-12-07: 25 mL via ORAL

## 2014-12-07 MED ORDER — OXYCODONE-ACETAMINOPHEN 5-325 MG PO TABS: 1.0000 | ORAL_TABLET | Freq: Four times a day (QID) | ORAL | Status: DC | PRN

## 2014-12-07 NOTE — Discharge Instructions (Signed)
Abdominal Pain, Women °Abdominal (stomach, pelvic, or belly) pain can be caused by many things. It is important to tell your doctor: °· The location of the pain. °· Does it come and go or is it present all the time? °· Are there things that start the pain (eating certain foods, exercise)? °· Are there other symptoms associated with the pain (fever, nausea, vomiting, diarrhea)? °All of this is helpful to know when trying to find the cause of the pain. °CAUSES  °· Stomach: virus or bacteria infection, or ulcer. °· Intestine: appendicitis (inflamed appendix), regional ileitis (Crohn's disease), ulcerative colitis (inflamed colon), irritable bowel syndrome, diverticulitis (inflamed diverticulum of the colon), or cancer of the stomach or intestine. °· Gallbladder disease or stones in the gallbladder. °· Kidney disease, kidney stones, or infection. °· Pancreas infection or cancer. °· Fibromyalgia (pain disorder). °· Diseases of the female organs: °¨ Uterus: fibroid (non-cancerous) tumors or infection. °¨ Fallopian tubes: infection or tubal pregnancy. °¨ Ovary: cysts or tumors. °¨ Pelvic adhesions (scar tissue). °¨ Endometriosis (uterus lining tissue growing in the pelvis and on the pelvic organs). °¨ Pelvic congestion syndrome (female organs filling up with blood just before the menstrual period). °¨ Pain with the menstrual period. °¨ Pain with ovulation (producing an egg). °¨ Pain with an IUD (intrauterine device, birth control) in the uterus. °¨ Cancer of the female organs. °· Functional pain (pain not caused by a disease, may improve without treatment). °· Psychological pain. °· Depression. °DIAGNOSIS  °Your doctor will decide the seriousness of your pain by doing an examination. °· Blood tests. °· X-rays. °· Ultrasound. °· CT scan (computed tomography, special type of X-ray). °· MRI (magnetic resonance imaging). °· Cultures, for infection. °· Barium enema (dye inserted in the large intestine, to better view it with  X-rays). °· Colonoscopy (looking in intestine with a lighted tube). °· Laparoscopy (minor surgery, looking in abdomen with a lighted tube). °· Major abdominal exploratory surgery (looking in abdomen with a large incision). °TREATMENT  °The treatment will depend on the cause of the pain.  °· Many cases can be observed and treated at home. °· Over-the-counter medicines recommended by your caregiver. °· Prescription medicine. °· Antibiotics, for infection. °· Birth control pills, for painful periods or for ovulation pain. °· Hormone treatment, for endometriosis. °· Nerve blocking injections. °· Physical therapy. °· Antidepressants. °· Counseling with a psychologist or psychiatrist. °· Minor or major surgery. °HOME CARE INSTRUCTIONS  °· Do not take laxatives, unless directed by your caregiver. °· Take over-the-counter pain medicine only if ordered by your caregiver. Do not take aspirin because it can cause an upset stomach or bleeding. °· Try a clear liquid diet (broth or water) as ordered by your caregiver. Slowly move to a bland diet, as tolerated, if the pain is related to the stomach or intestine. °· Have a thermometer and take your temperature several times a day, and record it. °· Bed rest and sleep, if it helps the pain. °· Avoid sexual intercourse, if it causes pain. °· Avoid stressful situations. °· Keep your follow-up appointments and tests, as your caregiver orders. °· If the pain does not go away with medicine or surgery, you may try: °¨ Acupuncture. °¨ Relaxation exercises (yoga, meditation). °¨ Group therapy. °¨ Counseling. °SEEK MEDICAL CARE IF:  °· You notice certain foods cause stomach pain. °· Your home care treatment is not helping your pain. °· You need stronger pain medicine. °· You want your IUD removed. °· You feel faint or   lightheaded. °· You develop nausea and vomiting. °· You develop a rash. °· You are having side effects or an allergy to your medicine. °SEEK IMMEDIATE MEDICAL CARE IF:  °· Your  pain does not go away or gets worse. °· You have a fever. °· Your pain is felt only in portions of the abdomen. The right side could possibly be appendicitis. The left lower portion of the abdomen could be colitis or diverticulitis. °· You are passing blood in your stools (bright red or black tarry stools, with or without vomiting). °· You have blood in your urine. °· You develop chills, with or without a fever. °· You pass out. °MAKE SURE YOU:  °· Understand these instructions. °· Will watch your condition. °· Will get help right away if you are not doing well or get worse. °Document Released: 02/20/2007 Document Revised: 09/09/2013 Document Reviewed: 03/12/2009 °[employer]® Patient Information ©2015 [employer]. This information is not intended to replace advice given to you by your health care provider. Make sure you discuss any questions you have with your health care provider. ° °

## 2014-12-07 NOTE — ED Provider Notes (Signed)
[facility] Emergency Department Provider Note     Time seen: ----------------------------------------- 3:01 PM on 12/07/2014 -----------------------------------------    I have reviewed the triage vital signs and the nursing notes.   HISTORY  Chief Complaint Flank Pain    HPI Lauren Anthony is a 44 y.o. female who presents ER with right lower quadrant pain. Patient states she hurt all night in the right lower quadrant and it was sharp and stabbing, nothing makes it worse. She was seen recently by OB/GYN due to a "blood pocket in her left ovary". Patient reports she had a UTI at that time but completed a course of Macrobid. She has had some urgency but denies any burning when she urinates. Denies any vaginal bleeding or discharge. Was seen by OB/GYN 1 week ago and was told everything looked fine.   History reviewed. No pertinent past medical history.  There are no active problems to display for this patient.   History reviewed. No pertinent past surgical history.  Allergies Review of patient's allergies indicates no known allergies.  Social History History  Substance Use Topics  . Smoking status: Never Smoker   . Smokeless tobacco: Not on file  . Alcohol Use: No    Review of Systems Constitutional: Negative for fever. Eyes: Negative for visual changes. ENT: Negative for sore throat. Cardiovascular: Negative for chest pain. Respiratory: Negative for shortness of breath. Gastrointestinal: Positive for right lower quadrant pain Genitourinary: Negative for dysuria. Musculoskeletal: Negative for back pain. Skin: Negative for rash. Neurological: Negative for headaches, focal weakness or numbness.  10-point ROS otherwise negative.  ____________________________________________   PHYSICAL EXAM:  VITAL SIGNS: ED Triage Vitals  Enc Vitals Group     BP 12/07/14 1313 149/71 mmHg     Pulse --      Resp 12/07/14 1313 20     Temp 12/07/14  1313 98.4 F (36.9 C)     Temp Source 12/07/14 1313 Oral     SpO2 12/07/14 1313 100 %     Weight 12/07/14 1313 179 lb (81.194 kg)     Height 12/07/14 1313 5' 3"  (1.6 m)     Head Cir --      Peak Flow --      Pain Score 12/07/14 1314 5     Pain Loc --      Pain Edu? --      Excl. in [place]? --     Constitutional: Alert and oriented. Well appearing and in no distress. Eyes: Conjunctivae are normal. PERRL. Normal extraocular movements. ENT   Head: Normocephalic and atraumatic.   Nose: No congestion/rhinnorhea.   Mouth/Throat: Mucous membranes are moist.   Neck: No stridor. Cardiovascular: Normal rate, regular rhythm. Normal and symmetric distal pulses are present in all extremities. No murmurs, rubs, or gallops. Respiratory: Normal respiratory effort without tachypnea nor retractions. Breath sounds are clear and equal bilaterally. No wheezes/rales/rhonchi. Gastrointestinal: Mild right lower quadrant tenderness, no rebound or guarding. Musculoskeletal: Nontender with normal range of motion in all extremities. No joint effusions.  No lower extremity tenderness nor edema. Neurologic:  Normal speech and language. No gross focal neurologic deficits are appreciated. Speech is normal. No gait instability. Skin:  Skin is warm, dry and intact. No rash noted. Psychiatric: Mood and affect are normal. Speech and behavior are normal. Patient exhibits appropriate insight and judgment.  ____________________________________________  ED COURSE:  Pertinent labs & imaging results that were available during my care of the patient were reviewed by me and  considered in my medical decision making (see chart for details). We'll check abdominal labs and urine, patient will need CT imaging of the right lower quadrant ____________________________________________    LABS (pertinent positives/negatives)  Labs Reviewed  URINALYSIS COMPLETEWITH MICROSCOPIC ([place]) - Abnormal; Notable for the  following:    Color, Urine YELLOW (*)    APPearance CLEAR (*)    Bacteria, UA FEW (*)    Squamous Epithelial / LPF 0-5 (*)    All other components within normal limits  COMPREHENSIVE METABOLIC PANEL  CBC    RADIOLOGY Images were viewed CT abdomen and pelvis with contrast FINDINGS: Lower Chest: No acute findings.  Hepatobiliary: No masses or other significant abnormality identified. Prior cholecystectomy noted. No evidence of biliary dilatation.  Pancreas: No mass, inflammatory changes, or other significant abnormality identified.  Spleen: Within normal limits in size and appearance.  Adrenals: No masses identified.  Kidneys/Urinary Tract: No evidence of masses or hydronephrosis.  Stomach/Bowel/Peritoneum: No evidence of wall thickening, mass, or obstruction. Normal appendix visualized. 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  Vascular/Lymphatic: No pathologically enlarged lymph nodes identified. No abdominal aortic aneurysm or other significant retroperitoneal abnormality demonstrated.  Reproductive: No mass or other significant abnormality identified.  Other: None.  Musculoskeletal: No suspicious bone lesions identified.  IMPRESSION: Negative. No evidence of appendicitis or other acute findings within the abdomen or pelvis. _________________________________________  FINAL ASSESSMENT AND PLAN  Right lower quadrant pain   Plan: Patient with labs and imaging as dictated above. CT scan is unremarkable for any acute process. Recent ultrasound revealed this could be from cysts. She is in no acute distress, has a nonsurgical abdomen. Stable for outpatient follow-up pain medication.   Earleen Newport, MD   Earleen Newport, MD 12/07/14 [id number]

## 2014-12-07 NOTE — ED Notes (Signed)
Pt presents to the ER from home with complaints of right lower quadrant pain that radiates to right flank area. Pt repots she has been seen by OBYGYN due to blood pocket on left ovary, reports she had a UTI and finished Macrobid. Reports she has urgency and frequency denies burning sensation. Pt talks in complete sentences denies any other symptom;

## 2014-12-08 LAB — POCT PREGNANCY, URINE: Preg Test, Ur: NEGATIVE

## 2014-12-09 ENCOUNTER — Ambulatory Visit (INDEPENDENT_AMBULATORY_CARE_PROVIDER_SITE_OTHER): Payer: 59 | Admitting: Obstetrics and Gynecology

## 2014-12-09 ENCOUNTER — Encounter: Payer: Self-pay | Admitting: Obstetrics and Gynecology

## 2014-12-09 VITALS — BP 120/76 | HR 54 | Ht 63.0 in | Wt 182.1 lb

## 2014-12-09 DIAGNOSIS — R102 Pelvic and perineal pain: Secondary | ICD-10-CM

## 2014-12-09 DIAGNOSIS — N946 Dysmenorrhea, unspecified: Secondary | ICD-10-CM

## 2014-12-09 DIAGNOSIS — Z8742 Personal history of other diseases of the female genital tract: Secondary | ICD-10-CM

## 2014-12-09 DIAGNOSIS — Z9889 Other specified postprocedural states: Secondary | ICD-10-CM

## 2014-12-09 NOTE — Patient Instructions (Signed)
Patient to return in 1 week for pre-op.

## 2014-12-10 DIAGNOSIS — Z8742 Personal history of other diseases of the female genital tract: Secondary | ICD-10-CM | POA: Insufficient documentation

## 2014-12-10 DIAGNOSIS — N946 Dysmenorrhea, unspecified: Secondary | ICD-10-CM | POA: Insufficient documentation

## 2014-12-10 DIAGNOSIS — R102 Pelvic and perineal pain: Secondary | ICD-10-CM | POA: Insufficient documentation

## 2014-12-10 NOTE — Progress Notes (Signed)
GYNECOLOGY PROGRESS NOTE  Subjective:    Patient ID: KHANDI KERNES, female    DOB: [birth year], 44 y.o.   MRN: [id number]  HPI  Patient is a 44 y.o. [id number] female who presents for complaints of right sided pelvic pain.  Did have h/o left sided pelvic pain several weeks ago.  Was seen by [facility] who ordered pelvic ultrasound and labs.  Labs were normal, ultrasound noted a small hematometra at the fundus of the uterus.  Several days later patient reported continued right sided pelvic pain, was diagnosed with UTI, treated.  Notes that pain resolved, however now is feeling a different pain on left.  Pain is described as "gnawing/pulling".  Notes that it is worsened after sitting still for several hours or lying recumbent.  States that she takes a 1/2 tab of Vicodin for pain as needed, however pain did not improve with medication and has been waking her up from her sleep.  Was hesitant about increasing the dose of the medication. Of note, patient is 6 months s/p endometrial ablation. Has been taking OCPs x 1 month to help with pelvic pain.   The following portions of the patient's history were reviewed and updated as appropriate: allergies, current medications, past family history, past medical history, past social history, past surgical history and problem list.  Review of Systems Pertinent items are noted in HPI.   Objective:   Blood pressure 120/76, pulse 54, height 5' 3"  (1.6 m), weight 182 lb 2 oz (82.611 kg), last menstrual period 06/30/2014. General appearance: alert Abdomen: normal findings: bowel sounds normal, no masses palpable and no organomegaly and abnormal findings:  mild tenderness in the RLQ Pelvic: deferred (request)   Assessment:   1. Pelvic pain in female, worsening 2. Hematotmetra, s/p endometrial ablation  Plan:   1. Lengthy discussion had with patient regarding further management of pelvic pain.  Patient reports that she is now at the point where she is  considering hysterectomy as she had been dealing with dysmenorrhea and heavy menstrual bleeding prior to the ablation.  Notes that bleeding has greatly improved, but now cannot continue to tolerate the pain. Inquires about tubal ligation/ablation syndrome which she read about online.  Discussed that this could be a possible cause of her pain.  Also with the hematometra, if it continues to enlarge, this can also cause pain as the blood is not shedding.  Patient also with h/o 2 previous C-sections, so could have pelvic adhesions. Counseled on several management options including D&C, laparoscopy with lysis of adhesions, or hysterectomy.  Patient desires hysterectomy.  Desires to keep cervix.  Will perform supracervical TLH with morcellation.     To be scheduled for 12/23/14.

## 2014-12-16 ENCOUNTER — Ambulatory Visit (INDEPENDENT_AMBULATORY_CARE_PROVIDER_SITE_OTHER): Payer: 59 | Admitting: Obstetrics and Gynecology

## 2014-12-16 ENCOUNTER — Encounter: Payer: Self-pay | Admitting: Obstetrics and Gynecology

## 2014-12-16 VITALS — BP 114/73 | HR 63 | Ht 63.0 in | Wt 180.3 lb

## 2014-12-16 DIAGNOSIS — Z9889 Other specified postprocedural states: Secondary | ICD-10-CM

## 2014-12-16 DIAGNOSIS — N857 Hematometra: Secondary | ICD-10-CM

## 2014-12-16 DIAGNOSIS — N939 Abnormal uterine and vaginal bleeding, unspecified: Secondary | ICD-10-CM

## 2014-12-16 DIAGNOSIS — R102 Pelvic and perineal pain: Secondary | ICD-10-CM

## 2014-12-16 NOTE — H&P (Signed)
Subjective:    Patient is a 44 y.o. G3P1231fmale scheduled for total laparoscopic hysterectomy with bilateral salpingectomy and possible unilateral oophorectomy. Indications for procedure are intermittent pelvic pain and new onset vaginal bleeding, is s/p endometrial ablation ~ 7 months ago.   Pertinent Gynecological History: Patient's last menstrual period was 12/14/2014. Menses: was heavy, irregular prior to ablation.  Has had no further bleeding over the past several months until 2 days ago, when bleeding resumed, became heavy Bleeding: dysfunctional uterine bleeding Contraception: tubal ligation Last mammogram: normal Date: 2015 Last pap: normal Date: 2015  Discussed Blood/Blood Products: yes   Menstrual History: Obstetric History   G3   P3   T1   P2   A0   TAB0   SAB0   E0   M0   L3     # Outcome Date GA Lbr Len/2nd Weight Sex Delivery Anes PTL Lv  3 Preterm 11/21/99 347w0d F CS-Unspec   Y  2 Preterm 01/03/93 [id number] VBAC  Y Y  1 Term 08/12/87    F CS-Unspec   Y      Past Medical History  Diagnosis Date  . Heavy periods   . Cholelithiases 2016    Past Surgical History  Procedure Laterality Date  . Novasure ablation  06/2014  . Cesarean section    . Endometrial ablation    . Cholecystectomy    . Laparoscopic gastric sleeve resection      Family History  Problem Relation Age of Onset  . Cancer Neg Hx   . Heart disease Neg Hx   . Diabetes Neg Hx     Social History   Social History  . Marital Status: Married    Spouse Name: N/A  . Number of Children: N/A  . Years of Education: N/A   Occupational History  . CLERICAL [employer]   Social History Main Topics  . Smoking status: Never Smoker   . Smokeless tobacco: None  . Alcohol Use: 0.0 oz/week    0 Standard drinks or equivalent per week     Comment: rare  . Drug Use: No  . Sexual Activity:    Partners: Male   Other Topics Concern  . None   Social History Narrative    Outpatient Encounter  Prescriptions as of 12/16/2014  Medication Sig Note  . calcium carbonate (OS-CAL) 600 MG TABS tablet Take 600 mg by mouth 2 (two) times daily with a meal.   . Cholecalciferol (VITAMIN D-1000 MAX ST) 1000 UNITS tablet Take by mouth. 12/16/2014: Re[place] from: [place] HYDROcodone-acetaminophen (NORCO/VICODIN) 5-325 MG per tablet Take 1 tablet by mouth every 6 (six) hours as needed.   . Multiple Vitamins-Minerals (MULTIVITAMIN WITH MINERALS) tablet Take 1 tablet by mouth daily.   . [DISCONTINUED] norethindrone (MICRONOR,CAMILA,ERRIN) 0.35 MG tablet Take 1 tablet (0.35 mg total) by mouth daily.   . [DISCONTINUED] oxyCODONE-acetaminophen (ROXICET) 5-325 MG per tablet Take 1 tablet by mouth every 6 (six) hours as needed. (Patient not taking: Reported on 12/09/2014)    No facility-administered encounter medications on file as of 12/16/2014.    No Known Allergies  Review of Systems Constitutional: No recent fever/chills/sweats Respiratory: No recent cough/bronchitis Cardiovascular: No chest pain Gastrointestinal: No recent nausea/vomiting/diarrhea Genitourinary: No UTI symptoms Hematologic/lymphatic:No history of coagulopathy or recent blood thinner use    Objective:    BP 114/73 mmHg  Pulse 63  Ht 5' 3"  (1.6 m)  Wt 180 lb 4.8 oz (81.784  kg)  BMI 31.95 kg/m2  LMP 12/14/2014  General:   alert, cooperative and no distress  Skin:   normal, no lesions, rashes  HEENT:  Normal  Neck:  Supple without Adenopathy or Thyromegaly  Lungs:   Heart:              Breasts:   Abdomen:  Pelvis:  M/S   Extremeties:  Neuro:    clear to auscultation bilaterally   Normal without murmur   Not Examined   soft, non-tender; bowel sounds normal; no masses,  no organomegaly   Exam deferred to OR  No CVAT  Warm/Dry   Normal       Labs:  Lab Results  Component Value Date   WBC 6.8 12/07/2014   HGB 13.5 12/07/2014   HCT 40.2 12/07/2014   MCV 86.6 12/07/2014   PLT 234  12/07/2014   Lab Results  Component Value Date   CREATININE 0.69 12/07/2014   BUN 11 12/07/2014   NA 140 12/07/2014   K 4.2 12/07/2014   CL 107 12/07/2014   CO2 25 12/07/2014     Assessment:    H/o endometrial ablation, 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  H/o recent hematometra   Plan:   Counseling: Procedure, risks, reasons, benefits and complications (including injury to bowel, bladder, major blood vessel, ureter, bleeding, possibility of transfusion, infection, or fistula formation) reviewed in detail. Reviewed alternative treatment options once again, which patient declined. Consent signed. Preop testing ordered. Instructions reviewed, including NPO after midnight.   Rubie Maid, MD [facility]

## 2014-12-16 NOTE — Progress Notes (Signed)
Patient ID: Lauren Anthony, female   DOB: October 08, 1970, 44 y.o.   MRN: [id number] Pre-op appt lsh 12/22/2014- pp

## 2014-12-16 NOTE — Progress Notes (Signed)
GYNECOLOGY PROGRESS NOTE  Subjective:    Patient ID: Lauren Anthony, female    DOB: 04-08-1971, 44 y.o.   MRN: [id number]  HPI  Patient is a 44 y.o. [id number] female who presents for pre-op appointment.  Is scheduled for a laparoscopic supracervical hysterectomy with bilateral salpingectomy and possible unilateral oophorectomy.  Patient has complaints of vaginal bleeding x 2 days.  Notes that she has not had any bleeding since her ablation ~ 7 months ago, but now is bleeding fairly heavily.  Has stopped taking OCPs as recommended last week.  However, now her pain has subsided.   The following portions of the patient's history were reviewed and updated as appropriate: allergies, current medications, past family history, past medical history, past social history, past surgical history and problem list.  Review of Systems Pertinent items are noted in HPI.   Denies chest pain, SOB, dizziness, weakness, or fatigue.   Objective:   Blood pressure 114/73, pulse 63, height 5' 3"  (1.6 m), weight 180 lb 4.8 oz (81.784 kg), last menstrual period 12/14/2014. General appearance: alert, cooperative and no distress  See H&P for full exam.    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  H/o hematometra  Plan:   Patient desires surgical management with hysterectomy.  Initially desired supracervical hysterectomy, however after further discussion, desires complete hysterectomy.  Will retain ovaries unless diseased appearing. Removal of both fallopian tubes discussed, desired.  The risks of surgery were discussed in detail with the patient including but not limited to: bleeding which may require transfusion or reoperation; infection which may require prolonged hospitalization or re-hospitalization and antibiotic therapy; injury to bowel, bladder, ureters and major vessels or other surrounding organs; need for  additional procedures including laparotomy; thromboembolic phenomenon, incisional problems and other postoperative or anesthesia complications.  Patient was told that the likelihood that her condition and symptoms will be treated effectively with this surgical management was very high; the postoperative expectations were also discussed in detail. The patient also understands the alternative treatment options which were discussed in full. All questions were answered.  She was told that she will be contacted by our surgical scheduler regarding the time and date of her surgery; routine preoperative instructions of having nothing to eat or drink after midnight on the day prior to surgery and also coming to the hospital 1.5 hours prior to her time of surgery were also emphasized.  She was told she may be called for a preoperative appointment about a week prior to surgery and will be given further preoperative instructions at that visit. Printed patient education handouts about the procedure were given to the patient to review at home.  Rubie Maid, MD [facility]

## 2014-12-17 ENCOUNTER — Telehealth: Payer: Self-pay

## 2014-12-17 ENCOUNTER — Encounter: Payer: Self-pay | Admitting: Obstetrics and Gynecology

## 2014-12-17 ENCOUNTER — Encounter
Admission: RE | Admit: 2014-12-17 | Discharge: 2014-12-17 | Disposition: A | Discharge status: Home / Self Care | Payer: 59 | Source: Ambulatory Visit | Attending: Obstetrics and Gynecology | Admitting: Obstetrics and Gynecology

## 2014-12-17 DIAGNOSIS — Z01812 Encounter for preprocedural laboratory examination: Secondary | ICD-10-CM | POA: Diagnosis not present

## 2014-12-17 DIAGNOSIS — N857 Hematometra: Secondary | ICD-10-CM | POA: Insufficient documentation

## 2014-12-17 LAB — RAPID HIV SCREEN (HIV 1/2 AB+AG)
HIV 1/2 Antibodies: NONREACTIVE
HIV-1 P24 Antigen - HIV24: NONREACTIVE

## 2014-12-17 LAB — ABO/RH: ABO/RH(D): A POS

## 2014-12-17 LAB — TYPE AND SCREEN
ABO/RH(D): A POS
Antibody Screen: NEGATIVE

## 2014-12-17 LAB — CBC
HEMATOCRIT: 42.5 % (ref 35.0–47.0)
HEMOGLOBIN: 14.2 g/dL (ref 12.0–16.0)
MCH: 29.2 pg (ref 26.0–34.0)
MCHC: 33.3 g/dL (ref 32.0–36.0)
MCV: 87.7 fL (ref 80.0–100.0)
Platelets: 250 10*3/uL (ref 150–440)
RBC: 4.85 MIL/uL (ref 3.80–5.20)
RDW: 14 % (ref 11.5–14.5)
WBC: 6.3 10*3/uL (ref 3.6–11.0)

## 2014-12-17 LAB — BASIC METABOLIC PANEL
Anion gap: 10 (ref 5–15)
BUN: 14 mg/dL (ref 6–20)
CO2: 23 mmol/L (ref 22–32)
Calcium: 9.5 mg/dL (ref 8.9–10.3)
Chloride: 103 mmol/L (ref 101–111)
Creatinine, Ser: 0.84 mg/dL (ref 0.44–1.00)
GFR calc Af Amer: >60 mL/min (ref >60)
GLUCOSE: 102 mg/dL — AB (ref 65–99)
POTASSIUM: 3.8 mmol/L (ref 3.5–5.1)
SODIUM: 136 mmol/L (ref 135–145)

## 2014-12-17 LAB — HM HIV SCREENING LAB: HM HIV Screening: NEGATIVE

## 2014-12-17 NOTE — Patient Instructions (Signed)
  Your procedure is scheduled on: 12/22/14 Mon Report to Day Surgery. To find out your arrival time please call [phone] between 1PM - 3PM on 12/19/14 Fri.  Remember: Instructions that are not followed completely may result in serious medical risk, up to and including death, or upon the discretion of your surgeon and anesthesiologist your surgery may need to be rescheduled.    _x___ 1. Do not eat food or drink liquids after midnight. No gum chewing or hard candies.     ____ 2. No Alcohol for 24 hours before or after surgery.   ____ 3. Bring all medications with you on the day of surgery if instructed.    __x__ 4. Notify your doctor if there is any change in your medical condition     (cold, fever, infections).     Do not wear jewelry, make-up, hairpins, clips or nail polish.  Do not wear lotions, powders, or perfumes. You may wear deodorant.  Do not shave 48 hours prior to surgery. Men may shave face and neck.  Do not bring valuables to the hospital.    [facility] is not responsible for any belongings or valuables.               Contacts, dentures or bridgework may not be worn into surgery.  Leave your suitcase in the car. After surgery it may be brought to your room.  For patients admitted to the hospital, discharge time is determined by your                treatment team.   Patients discharged the day of surgery will not be allowed to drive home.   Please read over the following fact sheets that you were given:      _x___ Take these medicines the morning of surgery with A SIP OF WATER:    1. HYDROcodone-acetaminophen (NORCO/VICODIN) 5-325 MG per tablet  2.   3.   4.  5.  6.  ____ Fleet Enema (as directed)   _x___ Use CHG Soap as directed  ____ Use inhalers on the day of surgery  ____ Stop metformin 2 days prior to surgery    ____ Take 1/2 of usual insulin dose the night before surgery and none on the morning of surgery.   ____ Stop Coumadin/Plavix/aspirin on    ____ Stop Anti-inflammatories on    ____ Stop supplements until after surgery.    ____ Bring C-Pap to the hospital.

## 2014-12-17 NOTE — Telephone Encounter (Signed)
Left message that her FMLA papers were ready for her to pick up. They were faxed and confirmation received.

## 2014-12-18 ENCOUNTER — Encounter
Admission: RE | Admit: 2014-12-18 | Discharge: 2014-12-18 | Disposition: A | Discharge status: Home / Self Care | Payer: 59 | Source: Ambulatory Visit | Attending: Obstetrics and Gynecology | Admitting: Obstetrics and Gynecology

## 2014-12-18 LAB — HEPATITIS C ANTIBODY: HCV Ab: <0.1 s/co ratio (ref 0.0–0.9)

## 2014-12-18 LAB — RPR: RPR Ser Ql: NONREACTIVE

## 2014-12-19 LAB — HEPATITIS B SURFACE ANTIGEN: HEP B S AG: NEGATIVE

## 2014-12-22 ENCOUNTER — Encounter: Payer: Self-pay | Admitting: *Deleted

## 2014-12-22 ENCOUNTER — Observation Stay
Admission: RE | Admit: 2014-12-22 | Discharge: 2014-12-23 | Disposition: A | Discharge status: Home / Self Care | Payer: 59 | Source: Ambulatory Visit | Attending: Obstetrics and Gynecology | Admitting: Obstetrics and Gynecology

## 2014-12-22 ENCOUNTER — Ambulatory Visit: Payer: 59 | Admitting: Registered Nurse

## 2014-12-22 ENCOUNTER — Encounter
Admission: RE | Disposition: A | Discharge status: Home / Self Care | Payer: Self-pay | Source: Ambulatory Visit | Attending: Obstetrics and Gynecology

## 2014-12-22 DIAGNOSIS — Z9049 Acquired absence of other specified parts of digestive tract: Secondary | ICD-10-CM | POA: Insufficient documentation

## 2014-12-22 DIAGNOSIS — D251 Intramural leiomyoma of uterus: Secondary | ICD-10-CM | POA: Insufficient documentation

## 2014-12-22 DIAGNOSIS — Z79899 Other long term (current) drug therapy: Secondary | ICD-10-CM | POA: Insufficient documentation

## 2014-12-22 DIAGNOSIS — Z9851 Tubal ligation status: Secondary | ICD-10-CM | POA: Diagnosis not present

## 2014-12-22 DIAGNOSIS — N857 Hematometra: Secondary | ICD-10-CM | POA: Insufficient documentation

## 2014-12-22 DIAGNOSIS — M549 Dorsalgia, unspecified: Secondary | ICD-10-CM | POA: Insufficient documentation

## 2014-12-22 DIAGNOSIS — N736 Female pelvic peritoneal adhesions (postinfective): Secondary | ICD-10-CM | POA: Insufficient documentation

## 2014-12-22 DIAGNOSIS — R102 Pelvic and perineal pain: Secondary | ICD-10-CM | POA: Diagnosis present

## 2014-12-22 DIAGNOSIS — N800 Endometriosis of uterus: Secondary | ICD-10-CM | POA: Diagnosis not present

## 2014-12-22 DIAGNOSIS — Z9884 Bariatric surgery status: Secondary | ICD-10-CM | POA: Diagnosis not present

## 2014-12-22 DIAGNOSIS — N949 Unspecified condition associated with female genital organs and menstrual cycle: Secondary | ICD-10-CM | POA: Diagnosis not present

## 2014-12-22 DIAGNOSIS — N888 Other specified noninflammatory disorders of cervix uteri: Secondary | ICD-10-CM | POA: Insufficient documentation

## 2014-12-22 DIAGNOSIS — N920 Excessive and frequent menstruation with regular cycle: Secondary | ICD-10-CM | POA: Diagnosis not present

## 2014-12-22 DIAGNOSIS — Z90710 Acquired absence of both cervix and uterus: Secondary | ICD-10-CM | POA: Diagnosis present

## 2014-12-22 DIAGNOSIS — N72 Inflammatory disease of cervix uteri: Principal | ICD-10-CM | POA: Insufficient documentation

## 2014-12-22 HISTORY — PX: BILATERAL SALPINGECTOMY: SHX5743

## 2014-12-22 HISTORY — PX: CYSTOSCOPY: SHX5120

## 2014-12-22 HISTORY — PX: LAPAROSCOPIC HYSTERECTOMY: SHX1926

## 2014-12-22 LAB — POCT PREGNANCY, URINE: PREG TEST UR: NEGATIVE

## 2014-12-22 SURGERY — HYSTERECTOMY, TOTAL, LAPAROSCOPIC
Anesthesia: General | Wound class: Clean Contaminated

## 2014-12-22 MED ORDER — LACTATED RINGERS IV SOLN
INTRAVENOUS | Status: DC
  Administered 2014-12-22 (×3): via INTRAVENOUS

## 2014-12-22 MED ORDER — ACETAMINOPHEN 10 MG/ML IV SOLN
INTRAVENOUS | Status: AC
  Filled 2014-12-22: qty 100

## 2014-12-22 MED ORDER — MIDAZOLAM HCL 2 MG/2ML IJ SOLN
INTRAMUSCULAR | Status: DC | PRN
  Administered 2014-12-22: 2 mg via INTRAVENOUS

## 2014-12-22 MED ORDER — LACTATED RINGERS IV SOLN: INTRAVENOUS | Status: DC

## 2014-12-22 MED ORDER — OXYCODONE-ACETAMINOPHEN 5-325 MG PO TABS
1.0000 | ORAL_TABLET | ORAL | Status: DC | PRN
  Administered 2014-12-22 (×2): 2 via ORAL
  Administered 2014-12-23: 1 via ORAL
  Administered 2014-12-23: 2 via ORAL
  Administered 2014-12-23 (×2): 1 via ORAL
  Filled 2014-12-22 (×2): qty 2
  Filled 2014-12-22: qty 1
  Filled 2014-12-22 (×2): qty 2

## 2014-12-22 MED ORDER — ONDANSETRON HCL 4 MG/2ML IJ SOLN
INTRAMUSCULAR | Status: DC | PRN
  Administered 2014-12-22: 4 mg via INTRAVENOUS

## 2014-12-22 MED ORDER — PROPOFOL 10 MG/ML IV BOLUS
INTRAVENOUS | Status: DC | PRN
  Administered 2014-12-22: 150 mg via INTRAVENOUS

## 2014-12-22 MED ORDER — MENTHOL 3 MG MT LOZG: 1.0000 | LOZENGE | OROMUCOSAL | Status: DC | PRN

## 2014-12-22 MED ORDER — FENTANYL CITRATE (PF) 100 MCG/2ML IJ SOLN
INTRAMUSCULAR | Status: AC
  Administered 2014-12-22: 25 ug via INTRAVENOUS
  Filled 2014-12-22: qty 2

## 2014-12-22 MED ORDER — ONDANSETRON HCL 4 MG/2ML IJ SOLN
4.0000 mg | Freq: Once | INTRAMUSCULAR | Status: AC | PRN
  Administered 2014-12-22: 4 mg via INTRAVENOUS

## 2014-12-22 MED ORDER — SIMETHICONE 80 MG PO CHEW
80.0000 mg | CHEWABLE_TABLET | Freq: Four times a day (QID) | ORAL | Status: DC | PRN
  Administered 2014-12-22 – 2014-12-23 (×2): 80 mg via ORAL
  Filled 2014-12-22 (×2): qty 1

## 2014-12-22 MED ORDER — FENTANYL CITRATE (PF) 100 MCG/2ML IJ SOLN
INTRAMUSCULAR | Status: DC | PRN
  Administered 2014-12-22: 50 ug via INTRAVENOUS
  Administered 2014-12-22: 100 ug via INTRAVENOUS
  Administered 2014-12-22: 50 ug via INTRAVENOUS

## 2014-12-22 MED ORDER — PANTOPRAZOLE SODIUM 40 MG PO TBEC
40.0000 mg | DELAYED_RELEASE_TABLET | Freq: Every day | ORAL | Status: DC
Start: 2014-12-22 — End: 2014-12-23
  Administered 2014-12-23: 40 mg via ORAL
  Filled 2014-12-22: qty 1

## 2014-12-22 MED ORDER — GLYCOPYRROLATE 0.2 MG/ML IJ SOLN
INTRAMUSCULAR | Status: DC | PRN
  Administered 2014-12-22: 0.2 mg via INTRAVENOUS

## 2014-12-22 MED ORDER — FENTANYL CITRATE (PF) 100 MCG/2ML IJ SOLN
INTRAMUSCULAR | Status: AC
  Administered 2014-12-22: 50 ug via INTRAVENOUS
  Filled 2014-12-22: qty 2

## 2014-12-22 MED ORDER — ACETAMINOPHEN 325 MG PO TABS: 650.0000 mg | ORAL_TABLET | Freq: Four times a day (QID) | ORAL | Status: DC | PRN

## 2014-12-22 MED ORDER — DEXTROSE 5 % IV SOLN
2.0000 g | INTRAVENOUS | Status: AC
  Administered 2014-12-22: 2 g via INTRAVENOUS
  Filled 2014-12-22: qty 2

## 2014-12-22 MED ORDER — KETOROLAC TROMETHAMINE 30 MG/ML IJ SOLN
INTRAMUSCULAR | Status: DC | PRN
  Administered 2014-12-22: 30 mg via INTRAVENOUS

## 2014-12-22 MED ORDER — FLUORESCEIN SODIUM 10 % IJ SOLN
INTRAMUSCULAR | Status: AC
  Filled 2014-12-22: qty 5

## 2014-12-22 MED ORDER — ONDANSETRON HCL 4 MG/2ML IJ SOLN: 4.0000 mg | Freq: Four times a day (QID) | INTRAMUSCULAR | Status: DC | PRN

## 2014-12-22 MED ORDER — DEXAMETHASONE SODIUM PHOSPHATE 4 MG/ML IJ SOLN
INTRAMUSCULAR | Status: DC | PRN
  Administered 2014-12-22: 10 mg via INTRAVENOUS

## 2014-12-22 MED ORDER — KETOROLAC TROMETHAMINE 30 MG/ML IJ SOLN: 30.0000 mg | Freq: Once | INTRAMUSCULAR | Status: DC

## 2014-12-22 MED ORDER — FLUORESCEIN SODIUM 10 % IJ SOLN
INTRAMUSCULAR | Status: DC | PRN
  Administered 2014-12-22: .5 mL via INTRAVENOUS

## 2014-12-22 MED ORDER — SUGAMMADEX SODIUM 200 MG/2ML IV SOLN
INTRAVENOUS | Status: DC | PRN
  Administered 2014-12-22: 200 mg via INTRAVENOUS

## 2014-12-22 MED ORDER — HYDROMORPHONE HCL 1 MG/ML IJ SOLN
0.2000 mg | INTRAMUSCULAR | Status: DC | PRN
  Administered 2014-12-22 (×2): 0.2 mg via INTRAVENOUS
  Filled 2014-12-22 (×2): qty 1

## 2014-12-22 MED ORDER — ONDANSETRON HCL 4 MG PO TABS: 4.0000 mg | ORAL_TABLET | Freq: Four times a day (QID) | ORAL | Status: DC | PRN

## 2014-12-22 MED ORDER — ACETAMINOPHEN 10 MG/ML IV SOLN
INTRAVENOUS | Status: DC | PRN
  Administered 2014-12-22: 1000 mg via INTRAVENOUS

## 2014-12-22 MED ORDER — BUPIVACAINE HCL (PF) 0.5 % IJ SOLN
INTRAMUSCULAR | Status: AC
  Filled 2014-12-22: qty 30

## 2014-12-22 MED ORDER — ALTEPLASE (STROKE) FULL DOSE INFUSION: 0.9000 mg/kg | Freq: Once | INTRAVENOUS | Status: DC

## 2014-12-22 MED ORDER — ROCURONIUM BROMIDE 100 MG/10ML IV SOLN
INTRAVENOUS | Status: DC | PRN
  Administered 2014-12-22: 40 mg via INTRAVENOUS
  Administered 2014-12-22: 50 mg via INTRAVENOUS
  Administered 2014-12-22 (×2): 30 mg via INTRAVENOUS

## 2014-12-22 MED ORDER — ONDANSETRON HCL 4 MG/2ML IJ SOLN
INTRAMUSCULAR | Status: AC
  Administered 2014-12-22: 4 mg via INTRAVENOUS
  Filled 2014-12-22: qty 2

## 2014-12-22 MED ORDER — LIDOCAINE HCL (CARDIAC) 20 MG/ML IV SOLN
INTRAVENOUS | Status: DC | PRN
Start: 2014-12-22 — End: 2014-12-22
  Administered 2014-12-22: 100 mg via INTRAVENOUS

## 2014-12-22 MED ORDER — BUPIVACAINE HCL (PF) 0.5 % IJ SOLN
INTRAMUSCULAR | Status: DC | PRN
  Administered 2014-12-22: 17 mL

## 2014-12-22 MED ORDER — LACTATED RINGERS IV SOLN
INTRAVENOUS | Status: DC
  Administered 2014-12-22: 18:00:00 via INTRAVENOUS

## 2014-12-22 MED ORDER — FENTANYL CITRATE (PF) 100 MCG/2ML IJ SOLN
25.0000 ug | INTRAMUSCULAR | Status: DC | PRN
  Administered 2014-12-22: 25 ug via INTRAVENOUS
  Administered 2014-12-22: 50 ug via INTRAVENOUS
  Administered 2014-12-22 (×3): 25 ug via INTRAVENOUS

## 2014-12-22 SURGICAL SUPPLY — 45 items
BAG COUNTER SPONGE EZ (MISCELLANEOUS) ×4 IMPLANT
BAG URO DRAIN 2000ML W/SPOUT (MISCELLANEOUS) ×4 IMPLANT
BARRIER ADHS 3X4 INTERCEED (GAUZE/BANDAGES/DRESSINGS) IMPLANT
BLADE SURG SZ11 CARB STEEL (BLADE) ×4 IMPLANT
CANISTER SUCT 1200ML W/VALVE (MISCELLANEOUS) ×4 IMPLANT
CATH FOLEY 2WAY  5CC 16FR (CATHETERS) ×1
CATH URTH 16FR FL 2W BLN LF (CATHETERS) ×3 IMPLANT
CHLORAPREP W/TINT 26ML (MISCELLANEOUS) ×4 IMPLANT
DEFOGGER SCOPE WARMER CLEARIFY (MISCELLANEOUS) ×4 IMPLANT
FILTER LAP SMOKE EVAC STRL (MISCELLANEOUS) IMPLANT
GLOVE BIO SURGEON STRL SZ 6 (GLOVE) ×4 IMPLANT
GLOVE BIOGEL PI IND STRL 6.5 (GLOVE) ×18 IMPLANT
GLOVE BIOGEL PI INDICATOR 6.5 (GLOVE) ×6
GOWN STRL REUS W/ TWL LRG LVL3 (GOWN DISPOSABLE) ×6 IMPLANT
GOWN STRL REUS W/TWL LRG LVL3 (GOWN DISPOSABLE) ×2
HANDLE YANKAUER SUCT BULB TIP (MISCELLANEOUS) ×4 IMPLANT
IRRIGATION STRYKERFLOW (MISCELLANEOUS) ×3 IMPLANT
IRRIGATOR STRYKERFLOW (MISCELLANEOUS) ×4
IV LACTATED RINGERS 1000ML (IV SOLUTION) ×4 IMPLANT
KIT RM TURNOVER CYSTO AR (KITS) ×4 IMPLANT
LABEL OR SOLS (LABEL) ×4 IMPLANT
LIQUID BAND (GAUZE/BANDAGES/DRESSINGS) ×4 IMPLANT
MANIPULATOR VCARE STD CRV RETR (MISCELLANEOUS) ×4 IMPLANT
MORCELLATOR XCISE  COR (MISCELLANEOUS)
MORCELLATOR XCISE COR (MISCELLANEOUS) IMPLANT
NS IRRIG 500ML POUR BTL (IV SOLUTION) ×4 IMPLANT
OCCLUDER COLPOPNEUMO (BALLOONS) ×4 IMPLANT
PACK GYN LAPAROSCOPIC (MISCELLANEOUS) ×4 IMPLANT
PAD GROUND ADULT SPLIT (MISCELLANEOUS) ×4 IMPLANT
PAD OB MATERNITY 4.3X12.25 (PERSONAL CARE ITEMS) ×4 IMPLANT
PAD PREP 24X41 OB/GYN DISP (PERSONAL CARE ITEMS) ×4 IMPLANT
PENCIL ELECTRO HAND CTR (MISCELLANEOUS) ×4 IMPLANT
SHEARS HARMONIC ACE PLUS 36CM (ENDOMECHANICALS) ×4 IMPLANT
SLEEVE ENDOPATH XCEL 5M (ENDOMECHANICALS) ×4 IMPLANT
SPONGE XRAY 4X4 16PLY STRL (MISCELLANEOUS) ×4 IMPLANT
STRIP CLOSURE SKIN 1/2X4 (GAUZE/BANDAGES/DRESSINGS) ×4 IMPLANT
SUT VIC AB 0 CT1 27 (SUTURE) ×1
SUT VIC AB 0 CT1 27XCR 8 STRN (SUTURE) ×3 IMPLANT
SUT VIC AB 0 CT2 27 (SUTURE) ×4 IMPLANT
SUT VIC AB 4-0 FS2 27 (SUTURE) ×4 IMPLANT
SYR 50ML LL SCALE MARK (SYRINGE) ×4 IMPLANT
SYRINGE 10CC LL (SYRINGE) ×4 IMPLANT
TROCAR ENDO BLADELESS 11MM (ENDOMECHANICALS) ×4 IMPLANT
TROCAR XCEL NON-BLD 5MMX100MML (ENDOMECHANICALS) ×4 IMPLANT
TUBING INSUFFLATOR HEATED (MISCELLANEOUS) ×4 IMPLANT

## 2014-12-22 NOTE — Anesthesia Postprocedure Evaluation (Signed)
  Anesthesia Post-op Note  Patient: Lauren Anthony  Procedure(s) Performed: Procedure(s): HYSTERECTOMY TOTAL LAPAROSCOPIC BILATERAL SALPINGECTOMY (Bilateral) CYSTOSCOPY  Anesthesia type:General  Patient location: PACU  Post pain: Pain level controlled  Post assessment: Post-op Vital signs reviewed, Patient's Cardiovascular Status Stable, Respiratory Function Stable, Patent Airway and No signs of Nausea or vomiting  Post vital signs: Reviewed and stable  Last Vitals:  Filed Vitals:   12/22/14 1105  BP: 135/85  Pulse: 94  Temp: 37.3 C  Resp: 20    Level of consciousness: awake, alert  and patient cooperative  Complications: No apparent anesthesia complications

## 2014-12-22 NOTE — Op Note (Signed)
Total Laparoscopic Hysterectomy Procedure Note  Indications: Lauren Anthony is a 44 y.o. [phone] female with h/o prior endometrial ablation for menorrhagia 7 months ago, now with recurring pelvic pain, and hematometra.  Pre-operative Diagnosis: pelvic pain, and hematometra.  Post-operative Diagnosis: Same, with pelvic adhesions  Operation: Total laparoscopic hysterectomy, bilateral salpingectomy, cystoscopy, adhesiolysis  Surgeon: Rubie Maid, MD   Assistants: Malachi Paradise, MD  Anesthesia: General endotracheal anesthesia  ASA Class: 2  Procedure Details  The patient was seen in the Holding Room. The risks, benefits, complications, treatment options, and expected outcomes were discussed with the patient.  The patient concurred with the proposed plan, giving informed consent.  The site of surgery properly noted/marked. The patient was taken to Operating Room # 5, identified as Lauren Anthony and the procedure verified as Total laparoscopic hysterectomy with bilateral salpingectomy. A Time Out was held and the above information confirmed.  After induction of anesthesia, the patient was draped and prepped in the usual sterile manner. Pt was placed in dorsal lithotomy position after anesthesia and draped and prepped in the usual sterile manner. Foley catheter was placed.   A sterile speculum was inserted into the vagina.  The anterior segment of the cervix was grasped with a single-toothed tenaculum.  The uterus was sounded to 9 cm. A V-care manipulator was placed at this time.    Atention was turned to the abdomen where an umbilical incision was made with the scalpel.  The Optiview 5-mm trocar and sleeve were then advanced without difficulty with the laparoscope under direct visualization into the abdomen.  The abdomen was then insufflated with carbon dioxide gas and adequate pneumoperitoneum was obtained.  A survey of the patient's pelvis and abdomen revealed the findings below.  Bilateral  lower quadrant ports (5 mm on the right and 5 mm on the left) were then placed under direct visualization.  The pelvis was then carefully examined.  Adhesiolysis of the omentum to the anterior abdominal wall was performed.  Attention was turned to the fallopian tubes; these were freed from the underlying mesosalpinx and the uterine attachments using the Harmonic device.  The bilateral round and broad ligaments were then clamped and transected with the Harmonic device.  The uterine artery was then skeletonized and a bladder flap was created.  There was dense scarring of the lower uterine segment to the bladder. The ureters were noted to be safely away from the area of dissection.  The bladder was then retrograde filled with 120 cc of saline for better visualization, and bluntly dissected off the lower uterine segment.  At this point, attention was turned to the uterine vessels, which were first coagulated using the Kleppinger monopolar device, and then clamped and ligated using the Harmonic scalpel.  Good hemostasis was noted overall.  The uterosacral and cardinal ligaments were clamped, cut and ligated bilaterally .  Attention was then turned to the cervicovaginal junction, and the Harmonic device was used to transect the cervix from the surrounding vagina using the ring of the V-care as a guide. This was done circumferentially allowing total hysterectomy.  The uterus was then removed from the vagina and the vaginal cuff incision was then closed from below with 0-Vicryl interrupted sutures.  Overall excellent hemostasis was noted.    Attention was then returned to the abdomen where the laparoscope was then reinserted into the abdominal cavity. The ureters were again identified with good peristalsis.  The abdominal pressure was reduced and hemostasis was confirmed.   Intravenous fluorescein dye  was administered, and cystoscopy showed bilateral ureteral jets.  No stitches were visualized int he bladder during  cystoscopy.  All trocars were removed under direct visualization, and the abdomen was desufflated.  The incisions were all closed with 4-0 Vicryl subcuticular stitches and Liquiband. The patient tolerated the procedures well.   All instruments, needles, and sponge counts were correct x 2. The patient was taken to the recovery room awake, extubated and in stable condition.   Approximately 20 min of the initial portion of the case was spent performing adhesiolysis of omental adhesions to anterior abdominal wall.   Findings: Omental adhesions to anterior abdominal wall.  Adhesions of lower uterine segment to bladder.  Normal appearing uterus, fallopian tubes, and ovaries.  Fallopian tubes previously surgically interrupted.  Normal appearing upper abdomen.   Estimated Blood Loss:  400 ml         Drains: foley catheter, with 1000 ml of clear urine at end of procedure.          Total IV Fluids: 1600 ml         Specimens: Uterus with cervix, bilateral fallopian tubes         Implants: None         Complications:  None; patient tolerated the procedure well.         Disposition: PACU - hemodynamically stable.         Condition: stable   SIGNED:  Rubie Maid, MD [facility]

## 2014-12-22 NOTE — Anesthesia Procedure Notes (Signed)
Procedure Name: Intubation Date/Time: 12/22/2014 7:34 AM Performed by: Doreen Salvage Pre-anesthesia Checklist: Patient identified, Patient being monitored, Timeout performed, Emergency Drugs available and Suction available Patient Re-evaluated:Patient Re-evaluated prior to inductionOxygen Delivery Method: Circle system utilized Preoxygenation: Pre-oxygenation with 100% oxygen Intubation Type: IV induction Ventilation: Mask ventilation without difficulty Laryngoscope Size: Mac and 3 Grade View: Grade I Tube type: Oral Tube size: 7.0 mm Number of attempts: 1 Airway Equipment and Method: Stylet Placement Confirmation: ETT inserted through vocal cords under direct vision,  positive ETCO2 and breath sounds checked- equal and bilateral Secured at: 21 cm Tube secured with: Tape Dental Injury: Teeth and Oropharynx as per pre-operative assessment

## 2014-12-22 NOTE — H&P (Signed)
UPDATE TO PREVIOUS HISTORY AND PHYSICAL  The patient has been seen and examined.  H&P is up to date, no changes noted.  Patient for TLH, bilateral salpingectomy, possible unilateral oophorectomy (if disease present).  Patient can proceed to the OR for scheduled procedure.   Rubie Maid, MD 12/22/2014 7:18 AM

## 2014-12-22 NOTE — Transfer of Care (Signed)
Immediate Anesthesia Transfer of Care Note  Patient: Lauren Anthony  Procedure(s) Performed: Procedure(s): HYSTERECTOMY TOTAL LAPAROSCOPIC BILATERAL SALPINGECTOMY (Bilateral) CYSTOSCOPY  Patient Location: PACU  Anesthesia Type:General  Level of Consciousness: sedated  Airway & Oxygen Therapy: Patient Spontanous Breathing and Patient connected to face mask oxygen  Post-op Assessment: Report given to RN and Post -op Vital signs reviewed and stable  Post vital signs: Reviewed and stable  Last Vitals:  Filed Vitals:   12/22/14 1105  BP: 135/85  Pulse: 97  Temp: 37.3 C  Resp:     Complications: No apparent anesthesia complications

## 2014-12-22 NOTE — Anesthesia Preprocedure Evaluation (Signed)
Anesthesia Evaluation  Patient identified by MRN, date of birth, ID band Patient awake    Reviewed: Allergy & Precautions, NPO status , Patient's Chart, lab work & pertinent test results  Airway Mallampati: I  TM Distance: >3 FB Neck ROM: Full    Dental  (+) Teeth Intact   Pulmonary    Pulmonary exam normal       Cardiovascular Exercise Tolerance: Good negative cardio ROS Normal cardiovascular exam    Neuro/Psych    GI/Hepatic negative GI ROS,   Endo/Other    Renal/GU      Musculoskeletal   Abdominal   Peds  Hematology   Anesthesia Other Findings   Reproductive/Obstetrics                             Anesthesia Physical Anesthesia Plan  ASA: II  Anesthesia Plan: General   Post-op Pain Management:    Induction: Intravenous  Airway Management Planned: Oral ETT  Additional Equipment:   Intra-op Plan:   Post-operative Plan: Extubation in OR  Informed Consent: I have reviewed the patients History and Physical, chart, labs and discussed the procedure including the risks, benefits and alternatives for the proposed anesthesia with the patient or authorized representative who has indicated his/her understanding and acceptance.     Plan Discussed with: CRNA  Anesthesia Plan Comments:         Anesthesia Quick Evaluation

## 2014-12-23 ENCOUNTER — Encounter: Payer: Self-pay | Admitting: Obstetrics and Gynecology

## 2014-12-23 DIAGNOSIS — N72 Inflammatory disease of cervix uteri: Secondary | ICD-10-CM | POA: Diagnosis not present

## 2014-12-23 LAB — CBC
HEMATOCRIT: 34.1 % — AB (ref 35.0–47.0)
HEMOGLOBIN: 11.3 g/dL — AB (ref 12.0–16.0)
MCH: 29.1 pg (ref 26.0–34.0)
MCHC: 33.1 g/dL (ref 32.0–36.0)
MCV: 87.9 fL (ref 80.0–100.0)
Platelets: 209 10*3/uL (ref 150–440)
RBC: 3.87 MIL/uL (ref 3.80–5.20)
RDW: 13.8 % (ref 11.5–14.5)
WBC: 12.1 10*3/uL — AB (ref 3.6–11.0)

## 2014-12-23 LAB — CREATININE, SERUM: Creatinine, Ser: 0.74 mg/dL (ref 0.44–1.00)

## 2014-12-23 LAB — SURGICAL PATHOLOGY

## 2014-12-23 MED ORDER — KETOROLAC TROMETHAMINE 30 MG/ML IJ SOLN
30.0000 mg | Freq: Four times a day (QID) | INTRAMUSCULAR | Status: DC
  Administered 2014-12-23: 30 mg via INTRAVENOUS
  Filled 2014-12-23: qty 1

## 2014-12-23 MED ORDER — DOCUSATE SODIUM 100 MG PO CAPS: 100.0000 mg | ORAL_CAPSULE | Freq: Two times a day (BID) | ORAL | Status: DC | PRN

## 2014-12-23 MED ORDER — KETOROLAC TROMETHAMINE 30 MG/ML IJ SOLN
30.0000 mg | Freq: Once | INTRAMUSCULAR | Status: AC
  Administered 2014-12-23: 30 mg via INTRAVENOUS
  Filled 2014-12-23: qty 1

## 2014-12-23 MED ORDER — HYDROCODONE-ACETAMINOPHEN 5-325 MG PO TABS: 1.0000 | ORAL_TABLET | Freq: Four times a day (QID) | ORAL | Status: DC | PRN

## 2014-12-23 MED ORDER — MAGNESIUM HYDROXIDE 400 MG/5ML PO SUSP
30.0000 mL | Freq: Once | ORAL | Status: AC
  Administered 2014-12-23: 30 mL via ORAL
  Filled 2014-12-23: qty 30

## 2014-12-23 MED ORDER — SIMETHICONE 80 MG PO CHEW
80.0000 mg | CHEWABLE_TABLET | Freq: Four times a day (QID) | ORAL | Status: DC
Start: 2014-12-23 — End: 2014-12-23
  Administered 2014-12-23: 80 mg via ORAL
  Filled 2014-12-23: qty 1

## 2014-12-23 MED ORDER — SIMETHICONE 80 MG PO CHEW: 80.0000 mg | CHEWABLE_TABLET | Freq: Four times a day (QID) | ORAL | Status: DC | PRN

## 2014-12-23 NOTE — Discharge Summary (Signed)
Gynecology Physician Postoperative Discharge Summary  Patient ID: Lauren Anthony MRN: [id number] DOB/AGE: 14-Feb-1971 44 y.o.  Admit Date: 12/22/2014 Discharge Date: 12/23/2014  Preoperative Diagnoses: Pelvic pain, hematometra, h/o endometrial ablation  Procedures: Procedure(s) (LRB): HYSTERECTOMY TOTAL LAPAROSCOPIC BILATERAL SALPINGECTOMY (Bilateral) CYSTOSCOPY  Significant Labs: CBC Latest Ref Rng 12/23/2014 12/17/2014 12/07/2014  WBC 3.6 - 11.0 K/uL 12.1(H) 6.3 6.8  Hemoglobin 12.0 - 16.0 g/dL 11.3(L) 14.2 13.5  Hematocrit 35.0 - 47.0 % 34.1(L) 42.5 40.2  Platelets 150 - 440 K/uL 209 250 234    Hospital Course:  Lauren Anthony is a 44 y.o. [id number]  admitted for scheduled surgery.  She underwent the procedures as mentioned above, her operation was uncomplicated. For further details about surgery, please refer to the operative report. Patient had an uncomplicated postoperative course. By time of discharge on POD#1, her pain was controlled on oral pain medications; she was ambulating, voiding without difficulty, tolerating regular diet and passing flatus. She was deemed stable for discharge to home.   Discharge Exam: Blood pressure 107/64, pulse 69, temperature 98 F (36.7 C), temperature source Oral, resp. rate 16, height 5' 3"  (1.6 m), weight 180 lb (81.647 kg), last menstrual period 06/30/2014, SpO2 99 %. General appearance: alert and no distress  Resp: clear to auscultation bilaterally  Cardio: regular rate and rhythm  GI: soft, non-tender; bowel sounds normal; no masses, no organomegaly.  Incision: C/D/I, no erythema, no drainage noted Pelvic: scant blood on pad  Extremities: extremities normal, atraumatic, no cyanosis or edema and Homans sign is negative, no sign of DVT  Discharged Condition: Stable  Disposition: 01-Home or Self Care     Medication List    TAKE these medications        calcium carbonate 600 MG Tabs tablet  Commonly known as:  OS-CAL  Take 600 mg by  mouth 2 (two) times daily.     cholecalciferol 1000 UNITS tablet  Commonly known as:  VITAMIN D  Take 1,000 Units by mouth daily.     docusate sodium 100 MG capsule  Commonly known as:  COLACE  Take 1 capsule (100 mg total) by mouth 2 (two) times daily as needed.     HYDROcodone-acetaminophen 5-325 MG per tablet  Commonly known as:  NORCO/VICODIN  Take 1-2 tablets by mouth every 6 (six) hours as needed.     multivitamin with minerals tablet  Take 1 tablet by mouth daily.     simethicone 80 MG chewable tablet  Commonly known as:  MYLICON  Chew 1 tablet (80 mg total) by mouth 4 (four) times daily as needed for flatulence.           Follow-up Information    Follow up with Rubie Maid, MD In 2 weeks.   Specialties:  Obstetrics and Gynecology, Radiology   Why:  For wound re-check   Contact information:   [address] [place] [address] [phone]       Signed:  Rubie Maid, MD [facility]

## 2014-12-23 NOTE — Discharge Instructions (Signed)
General Gynecological Post-Operative Instructions You may expect to feel dizzy, weak, and drowsy for as long as 24 hours after receiving the medicine that made you sleep (anesthetic).  Do not drive a car, ride a bicycle, participate in physical activities, or take public transportation until you are done taking narcotic pain medicines or as directed by your doctor.  Do not drink alcohol or take tranquilizers.  Do not take medicine that has not been prescribed by your doctor.  Do not sign important papers or make important decisions while on narcotic pain medicines.  Have a responsible person with you.  CARE OF INCISION  Keep incision clean and dry. Take showers instead of baths until your doctor gives you permission to take baths.  Avoid heavy lifting (more than 10 pounds/4.5 kilograms), pushing, or pulling.  Avoid activities that may risk injury to your surgical site.  No sexual intercourse or placement of anything in the vagina for 6 weeks or as instructed by your doctor. If you have tubes coming from the wound site, check with your doctor regarding appropriate care of the tubes. Only take prescription or over-the-counter medicines  for pain, discomfort, or fever as directed by your doctor. Do not take aspirin. It can make you bleed. Take medicines (antibiotics) that kill germs if they are prescribed for you.  Call the office or go to the Emergency Room if:  You feel sick to your stomach (nauseous).  You start to throw up (vomit).  You have trouble eating or drinking.  You have an oral temperature above 101.  You have constipation that is not helped by adjusting diet or increasing fluid intake. Pain medicines are a common cause of constipation.  You have any other concerns. SEEK IMMEDIATE MEDICAL CARE IF:  You have persistent dizziness.  You have difficulty breathing or a congested sounding (croupy) cough.  You have an oral temperature above 102.5, not controlled by medicine.  There is  increasing pain or tenderness near or in the surgical site.

## 2014-12-23 NOTE — Progress Notes (Addendum)
1 Day Post-Op Procedure(s) (LRB): HYSTERECTOMY TOTAL LAPAROSCOPIC BILATERAL SALPINGECTOMY (Bilateral) CYSTOSCOPY  Subjective: Patient reports tolerating PO, + flatus and no problems voiding. Does note pain in back and shoulders.  Has ambulated without difficulty.    Objective: I have reviewed patient's vital signs, intake and output and labs.  Temp:  [98 F (36.7 C)-99.1 F (37.3 C)] 98 F (36.7 C) (08/16 0439) Pulse Rate:  [51-126] 51 (08/16 0439) Resp:  [12-20] 16 (08/16 0439) BP: (113-141)/(62-90) 121/68 mmHg (08/16 0439) SpO2:  [96 %-100 %] 100 % (08/16 0439) Weight:  [180 lb (81.647 kg)] 180 lb (81.647 kg) (08/15 2016)  General: alert and mild distress Resp: clear to auscultation bilaterally Cardio: regular rate and rhythm, S1, S2 normal, no murmur, click, rub or gallop Abd: soft, non-tender; bowel sounds normal; no masses,  no organomegaly.  Incision sites clean/dry/intact with Liquiband.  Extremities: extremities normal, atraumatic, no cyanosis or edema and no edema, redness or tenderness in the calves or thighs Vaginal Bleeding: minimal   Labs:  CBC Latest Ref Rng 12/23/2014 12/17/2014 12/07/2014  WBC 3.6 - 11.0 K/uL 12.1(H) 6.3 6.8  Hemoglobin 12.0 - 16.0 g/dL 11.3(L) 14.2 13.5  Hematocrit 35.0 - 47.0 % 34.1(L) 42.5 40.2  Platelets 150 - 440 K/uL 209 250 234    Lab Results  Component Value Date   CREATININE 0.74 12/23/2014   CREATININE 0.84 12/17/2014   CREATININE 0.69 12/07/2014    Assessment: s/p Procedure(s): HYSTERECTOMY TOTAL LAPAROSCOPIC BILATERAL SALPINGECTOMY (Bilateral) CYSTOSCOPY: stable, progressing well and tolerating diet  Plan: Advance diet Continue to encourage ambulation Continue PO medication Maalox for gas/bloating Discharge home     [facility] 12/23/2014, 7:53 AM

## 2014-12-30 ENCOUNTER — Ambulatory Visit (INDEPENDENT_AMBULATORY_CARE_PROVIDER_SITE_OTHER): Payer: 59 | Admitting: Obstetrics and Gynecology

## 2014-12-30 VITALS — BP 112/75 | HR 68 | Resp 16 | Ht 63.0 in | Wt 175.7 lb

## 2014-12-30 DIAGNOSIS — Z9889 Other specified postprocedural states: Secondary | ICD-10-CM

## 2014-12-30 DIAGNOSIS — N8003 Adenomyosis of the uterus: Secondary | ICD-10-CM

## 2014-12-30 DIAGNOSIS — Z90710 Acquired absence of both cervix and uterus: Secondary | ICD-10-CM

## 2014-12-30 DIAGNOSIS — N809 Endometriosis, unspecified: Secondary | ICD-10-CM

## 2014-12-30 DIAGNOSIS — N800 Endometriosis of uterus: Secondary | ICD-10-CM

## 2014-12-30 NOTE — Progress Notes (Signed)
Subjective:     Lauren Anthony is a 44 y.o. female who presents to the clinic 1 weeks status post total laparoscopic hysterectomy for abnormal uterine bleeding and pelvic pain. Eating a regular diet without difficulty. Bowel movements are normal. Pain is controlled with current analgesics. Medications being used: acetaminophen and narcotic analgesics including Vicodin.  The following portions of the patient's history were reviewed and updated as appropriate: allergies, current medications, past family history, past medical history, past social history, past surgical history and problem list.  Review of Systems A comprehensive review of systems was negative except for: Constitutional: positive for fatigue    Objective:    LMP 12/14/2014 General:  alert and no distress  Abdomen: soft, bowel sounds active, non-tender  Incision:   healing well, no drainage, no erythema, no hernia, no seroma, no swelling, no dehiscence, incision well approximated       Pathology:  A. UTERUS CERVIX AND BILATERAL FALLOPIAN TUBES; HYSTERECTOMY WITH BILATERAL SALPINGECTOMY:  - PARTIALLY NECROTIC SUPERFICIAL ENDOMYOMETRIUM.  - CHRONIC CERVICITIS WITH NABOTHIAN CYSTS.  - FIBROUS SEROSAL ADHESIONS.  - MYOMETRIUM WITH ADENOMYOSIS.  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  - PORTION OF FALLOPIAN TUBE ADHESED TO UTERINE SEROSA.  - ONE UNDESIGNATED FALLOPIAN TUBE WITH BENIGN PARATUBAL CYSTS, STATUS  POST TUBAL LIGATION.  - SECOND UNREMARKABLE FALLOPIAN TUBE.  - NEGATIVE FOR ATYPIA AND MALIGNANCY.    Labs:  CBC Latest Ref Rng 12/23/2014 12/17/2014  WBC 3.6 - 11.0 K/uL 12.1(H) 6.3  Hemoglobin 12.0 - 16.0 g/dL 11.3(L) 14.2  Hematocrit 35.0 - 47.0 % 34.1(L) 42.5  Platelets 150 - 440 K/uL 209 250    Assessment:    Doing well postoperatively.   Operative findings again reviewed. Pathology report discussed.    Plan:    1. Continue any current medications. 2. Wound care discussed. 3. Activity restrictions: no  bending, stooping, or squatting, no lifting more than 15 pounds and pelvic rest 4. Anticipated return to work: 3 weeks. 5. Follow up: 3 weeks for final post-op check.    Rubie Maid, MD [facility]

## 2015-01-28 ENCOUNTER — Ambulatory Visit (INDEPENDENT_AMBULATORY_CARE_PROVIDER_SITE_OTHER): Payer: 59 | Admitting: Obstetrics and Gynecology

## 2015-01-28 ENCOUNTER — Encounter: Payer: Self-pay | Admitting: Obstetrics and Gynecology

## 2015-01-28 VITALS — BP 114/73 | HR 73 | Ht 63.0 in | Wt 177.2 lb

## 2015-01-28 DIAGNOSIS — Z90710 Acquired absence of both cervix and uterus: Secondary | ICD-10-CM

## 2015-01-28 DIAGNOSIS — Z9889 Other specified postprocedural states: Secondary | ICD-10-CM

## 2015-01-28 NOTE — Progress Notes (Signed)
Subjective:     Lauren Anthony is a 44 y.o. female who presents to the clinic 5 weeks status post total laparoscopic hysterectomy for abnormal uterine bleeding and pelvic pain. Eating a regular diet without difficulty. Bowel movements are normal. The patient is not having any pain.  The following portions of the patient's history were reviewed and updated as appropriate: allergies, current medications, past family history, past medical history, past social history, past surgical history and problem list.  Review of Systems A comprehensive review of systems was negative.    Objective:    BP 114/73 mmHg  Pulse 73  Ht 5' 3"  (1.6 m)  Wt 177 lb 3.2 oz (80.377 kg)  BMI 31.40 kg/m2  LMP 12/14/2014 General:  alert and no distress  Abdomen: soft, bowel sounds active, non-tender  Incision:   healing well, no drainage, no erythema, no hernia, no seroma, no swelling, no dehiscence, incision well approximated  Pelvis:   external genitalia normal. Vagina without discharge, vaginal cuff intact, nontender.  Small amount of suture material noted at apex of vaginal cuff.        Pathology:  A. UTERUS CERVIX AND BILATERAL FALLOPIAN TUBES; HYSTERECTOMY WITH BILATERAL SALPINGECTOMY:  - PARTIALLY NECROTIC SUPERFICIAL ENDOMYOMETRIUM.  - CHRONIC CERVICITIS WITH NABOTHIAN CYSTS.  - FIBROUS SEROSAL ADHESIONS.  - MYOMETRIUM WITH ADENOMYOSIS.  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  - PORTION OF FALLOPIAN TUBE ADHESED TO UTERINE SEROSA.  - ONE UNDESIGNATED FALLOPIAN TUBE WITH BENIGN PARATUBAL CYSTS, STATUS  POST TUBAL LIGATION.  - SECOND UNREMARKABLE FALLOPIAN TUBE.  - NEGATIVE FOR ATYPIA AND MALIGNANCY.    Labs:  CBC Latest Ref Rng 12/23/2014 12/17/2014  WBC 3.6 - 11.0 K/uL 12.1(H) 6.3  Hemoglobin 12.0 - 16.0 g/dL 11.3(L) 14.2  Hematocrit 35.0 - 47.0 % 34.1(L) 42.5  Platelets 150 - 440 K/uL 209 250    Assessment:    Doing well postoperatively.   Operative findings again reviewed. Pathology report  discussed.    Plan:    1. Continue any current medications. 2.  Activity restrictions: none 4. 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 5. Follow up:  As needed.  Rubie Maid, MD [facility]

## 2015-07-07 ENCOUNTER — Telehealth: Payer: 59 | Admitting: Family

## 2015-07-07 DIAGNOSIS — B9689 Other specified bacterial agents as the cause of diseases classified elsewhere: Secondary | ICD-10-CM

## 2015-07-07 DIAGNOSIS — A499 Bacterial infection, unspecified: Secondary | ICD-10-CM | POA: Diagnosis not present

## 2015-07-07 DIAGNOSIS — J329 Chronic sinusitis, unspecified: Secondary | ICD-10-CM | POA: Diagnosis not present

## 2015-07-07 MED ORDER — AMOXICILLIN-POT CLAVULANATE 875-125 MG PO TABS: 1.0000 | ORAL_TABLET | Freq: Two times a day (BID) | ORAL | Status: DC

## 2015-07-07 NOTE — Progress Notes (Signed)

## 2015-07-15 ENCOUNTER — Ambulatory Visit (INDEPENDENT_AMBULATORY_CARE_PROVIDER_SITE_OTHER): Payer: 59 | Admitting: Obstetrics and Gynecology

## 2015-07-15 VITALS — BP 119/74 | HR 73 | Ht 63.0 in | Wt 176.8 lb

## 2015-07-15 DIAGNOSIS — F419 Anxiety disorder, unspecified: Secondary | ICD-10-CM | POA: Diagnosis not present

## 2015-07-15 MED ORDER — CITALOPRAM HYDROBROMIDE 20 MG PO TABS: 20.0000 mg | ORAL_TABLET | Freq: Every day | ORAL | Status: DC

## 2015-07-15 NOTE — Patient Instructions (Signed)
Citalopram tablets What is this medicine? CITALOPRAM (sye TAL oh pram) is a medicine for depression. This medicine may be used for other purposes; ask your health care provider or pharmacist if you have questions. What should I tell my health care provider before I take this medicine? They need to know if you have any of these conditions: -bipolar disorder or a family history of bipolar disorder -diabetes -glaucoma -heart disease -history of irregular heartbeat -kidney or liver disease -low levels of magnesium or potassium in the blood -receiving electroconvulsive therapy -seizures (convulsions) -suicidal thoughts or a previous suicide attempt -an unusual or allergic reaction to citalopram, escitalopram, other medicines, foods, dyes, or preservatives -pregnant or trying to become pregnant -breast-feeding How should I use this medicine? Take this medicine by mouth with a glass of water. Follow the directions on the prescription label. You can take it with or without food. Take your medicine at regular intervals. Do not take your medicine more often than directed. Do not stop taking this medicine suddenly except upon the advice of your doctor. Stopping this medicine too quickly may cause serious side effects or your condition may worsen. A special MedGuide will be given to you by the pharmacist with each prescription and refill. Be sure to read this information carefully each time. Talk to your pediatrician regarding the use of this medicine in children. Special care may be needed. Patients over 19 years old may have a stronger reaction and need a smaller dose. Overdosage: If you think you have taken too much of this medicine contact a poison control center or emergency room at once. NOTE: This medicine is only for you. Do not share this medicine with others. What if I miss a dose? If you miss a dose, take it as soon as you can. If it is almost time for your next dose, take only that dose.  Do not take double or extra doses. What may interact with this medicine? Do not take this medicine with any of the following medications: -certain medicines for fungal infections like fluconazole, itraconazole, ketoconazole, posaconazole, voriconazole -cisapride -dofetilide -dronedarone -escitalopram -linezolid -MAOIs like Carbex, Eldepryl, Marplan, Nardil, and Parnate -methylene blue (injected into a vein) -pimozide -thioridazine -ziprasidone This medicine may also interact with the following medications: -alcohol -aspirin and aspirin-like medicines -carbamazepine -certain medicines for depression, anxiety, or psychotic disturbances -certain medicines for infections like chloroquine, clarithromycin, erythromycin, furazolidone, isoniazid, pentamidine -certain medicines for migraine headaches like almotriptan, eletriptan, frovatriptan, naratriptan, rizatriptan, sumatriptan, zolmitriptan -certain medicines for sleep -certain medicines that treat or prevent blood clots like dalteparin, enoxaparin, warfarin -cimetidine -diuretics -fentanyl -lithium -methadone -metoprolol -NSAIDs, medicines for pain and inflammation, like ibuprofen or naproxen -omeprazole -other medicines that prolong the QT interval (cause an abnormal heart rhythm) -procarbazine -rasagiline -supplements like St. John's wort, kava kava, valerian -tramadol -tryptophan This list may not describe all possible interactions. Give your health care provider a list of all the medicines, herbs, non-prescription drugs, or dietary supplements you use. Also tell them if you smoke, drink alcohol, or use illegal drugs. Some items may interact with your medicine. What should I watch for while using this medicine? Tell your doctor if your symptoms do not get better or if they get worse. Visit your doctor or health care professional for regular checks on your progress. Because it may take several weeks to see the full effects of  this medicine, it is important to continue your treatment as prescribed by your doctor. Patients and their families should watch out  for new or worsening thoughts of suicide or depression. Also watch out for sudden changes in feelings such as feeling anxious, agitated, panicky, irritable, hostile, aggressive, impulsive, severely restless, overly excited and hyperactive, or not being able to sleep. If this happens, especially at the beginning of treatment or after a change in dose, call your health care professional. Dennis Bast may get drowsy or dizzy. Do not drive, use machinery, or do anything that needs mental alertness until you know how this medicine affects you. Do not stand or sit up quickly, especially if you are an older patient. This reduces the risk of dizzy or fainting spells. Alcohol may interfere with the effect of this medicine. Avoid alcoholic drinks. Your mouth may get dry. Chewing sugarless gum or sucking hard candy, and drinking plenty of water will help. Contact your doctor if the problem does not go away or is severe. What side effects may I notice from receiving this medicine? Side effects that you should report to your doctor or health care professional as soon as possible: -allergic reactions like skin rash, itching or hives, swelling of the face, lips, or tongue -chest pain -confusion -dizziness -fast, irregular heartbeat -fast talking and excited feelings or actions that are out of control -feeling faint or lightheaded, falls -hallucination, loss of contact with reality -seizures -shortness of breath -suicidal thoughts or other mood changes -unusual bleeding or bruising Side effects that usually do not require medical attention (report to your doctor or health care professional if they continue or are bothersome): -blurred vision -change in appetite -change in sex drive or performance -headache -increased sweating -nausea -trouble sleeping This list may not describe all  possible side effects. Call your doctor for medical advice about side effects. You may report side effects to FDA at 1-800-FDA-1088. Where should I keep my medicine? Keep out of reach of children. Store at room temperature between 15 and 30 degrees C (59 and 86 degrees F). Throw away any unused medicine after the expiration date. NOTE: This sheet is a summary. It may not cover all possible information. If you have questions about this medicine, talk to your doctor, pharmacist, or health care provider.    2016, [employer]vier/Gold Standard. (2012-11-16 13:19:48)

## 2015-07-19 ENCOUNTER — Encounter: Payer: Self-pay | Admitting: Obstetrics and Gynecology

## 2015-07-19 NOTE — Progress Notes (Signed)
    Subjective:     Lauren Anthony is a 45 y.o. [phone] female who presents for new evaluation and treatment of anxiety disorder. She has the following anxiety symptoms: difficulty concentrating, feelings of losing control, insomnia, irritable and racing thoughts. Onset of symptoms was approximately several years ago. Symptoms have been gradually worsening since that time. She denies current suicidal and homicidal ideation. Family history significant for no psychiatric illness. Risk factors: none. Previous treatment includes Zoloft (~ 10 years ago, used for 2-3 months but did not notice an improvement in symptoms so discontinued).   The following portions of the patient's history were reviewed and updated as appropriate: allergies, current medications, past family history, past medical history, past social history, past surgical history and problem list.  Review of Systems Pertinent items noted in HPI and remainder of comprehensive ROS otherwise negative.    Objective:    BP 119/74 mmHg  Pulse 73  Ht 5' 3"  (1.6 m)  Wt 176 lb 12.8 oz (80.196 kg)  BMI 31.33 kg/m2  LMP 12/14/2014 General appearance: alert and no distress Head: Normocephalic, without obvious abnormality, atraumatic Neck: no adenopathy, no carotid bruit, no JVD, supple, symmetrical, trachea midline and thyroid not enlarged, symmetric, no tenderness/mass/nodules Lungs: clear to auscultation bilaterally Heart: regular rate and rhythm, S1, S2 normal, no murmur, click, rub or gallop Skin: Skin color, texture, turgor normal. No rashes or lesions      Lab Results  Component Value Date   WBC 12.1* 12/23/2014   HGB 11.3* 12/23/2014   HCT 34.1* 12/23/2014   MCV 87.9 12/23/2014   PLT 209 12/23/2014   Lab Results  Component Value Date   CREATININE 0.74 12/23/2014   BUN 14 12/17/2014   NA 136 12/17/2014   K 3.8 12/17/2014   CL 103 12/17/2014   CO2 23 12/17/2014   Lab Results  Component Value Date   ALT 21 12/07/2014   AST 24 12/07/2014   ALKPHOS 57 12/07/2014   BILITOT 0.6 12/07/2014   Lab Results  Component Value Date   TSH 1.620 11/20/2014     Assessment:    Anxiety disorder. Possible organic contributing causes are: none.   Plan:    Medications: Celexa prescribed.  To begin with 20 mg daily, and can increase to 40 mg in 2 weeks if no improvement in symptoms.  Labs: no labs indicated at this time. Recommended counseling. Instructed patient to contact office or on-call physician promptly should condition worsen or any new symptoms appear.  Follow up: 4-6 weeks.     Rubie Maid, MD [facility]

## 2015-07-23 ENCOUNTER — Encounter (INDEPENDENT_AMBULATORY_CARE_PROVIDER_SITE_OTHER): Payer: Self-pay

## 2015-07-23 ENCOUNTER — Telehealth: Payer: 59 | Admitting: Physician Assistant

## 2015-07-23 DIAGNOSIS — J208 Acute bronchitis due to other specified organisms: Secondary | ICD-10-CM

## 2015-07-23 MED ORDER — BENZONATATE 100 MG PO CAPS: 100.0000 mg | ORAL_CAPSULE | Freq: Two times a day (BID) | ORAL | Status: DC | PRN

## 2015-07-23 NOTE — Progress Notes (Signed)
We are sorry that you are not feeling well.  Here is how we plan to help!  Based on what you have shared with me it looks like you have upper respiratory tract inflammation that has resulted in a significant cough.  Inflammation and infection in the upper respiratory tract is commonly called bronchitis and has four common causes:  Allergies, Viral Infections, Acid Reflux and Bacterial Infections.  Allergies, viruses and acid reflux are treated by controlling symptoms or eliminating the cause. An example might be a cough caused by taking certain blood pressure medications. You stop the cough by changing the medication. Another example might be a cough caused by acid reflux. Controlling the reflux helps control the cough.  Based on your presentation I believe you most likely have A cough due to a virus.  This is called viral bronchitis and is best treated by rest, plenty of fluids and control of the cough.  You may use Ibuprofen or Tylenol as directed to help your symptoms.  Plain Mucinex will help to thin any chest congestion.   In addition you may use A prescription cough medication called Tessalon Perles 147m. You may take 1-2 capsules every 8 hours as needed for your cough.    HOME CARE . Only take medications as instructed by your medical team. . Complete the entire course of an antibiotic. . Drink plenty of fluids and get plenty of rest. . Avoid close contacts especially the very young and the elderly . Cover your mouth if you cough or cough into your sleeve. . Always remember to wash your hands . A steam or ultrasonic humidifier can help congestion.    GET HELP RIGHT AWAY IF: . You develop worsening fever. . You become short of breath . You cough up blood. . Your symptoms persist after you have completed your treatment plan MAKE SURE YOU   Understand these instructions.  Will watch your condition.  Will get help right away if you are not doing well or get worse.  Your e-visit  answers were reviewed by a board certified advanced clinical practitioner to complete your personal care plan.  Depending on the condition, your plan could have included both over the counter or prescription medications. If there is a problem please reply  once you have received a response from your provider. Your safety is important to [place]  If you have drug allergies check your prescription carefully.    You can use MyChart to ask questions about today's visit, request a non-urgent call back, or ask for a work or school excuse for 24 hours related to this e-Visit. If it has been greater than 24 hours you will need to follow up with your provider, or enter a new e-Visit to address those concerns. You will get an e-mail in the next two days asking about your experience.  I hope that your e-visit has been valuable and will speed your recovery. Thank you for using e-visits.

## 2015-08-08 ENCOUNTER — Encounter: Payer: Self-pay | Admitting: Obstetrics and Gynecology

## 2015-08-12 ENCOUNTER — Other Ambulatory Visit: Payer: Self-pay

## 2015-08-12 ENCOUNTER — Telehealth: Payer: 59 | Admitting: Nurse Practitioner

## 2015-08-12 DIAGNOSIS — F419 Anxiety disorder, unspecified: Secondary | ICD-10-CM

## 2015-08-12 DIAGNOSIS — R059 Cough, unspecified: Secondary | ICD-10-CM

## 2015-08-12 DIAGNOSIS — R05 Cough: Secondary | ICD-10-CM | POA: Diagnosis not present

## 2015-08-12 MED ORDER — CITALOPRAM HYDROBROMIDE 40 MG PO TABS: 40.0000 mg | ORAL_TABLET | Freq: Every day | ORAL | Status: DC

## 2015-08-12 MED ORDER — AZITHROMYCIN 250 MG PO TABS: ORAL_TABLET | ORAL | Status: DC

## 2015-08-12 NOTE — Progress Notes (Signed)
We are sorry that you are not feeling well.  Here is how we plan to help!  Based on what you have shared with me it looks like you have upper respiratory tract inflammation that has resulted in a significant cough.  Inflammation and infection in the upper respiratory tract is commonly called bronchitis and has four common causes:  Allergies, Viral Infections, Acid Reflux and Bacterial Infections.  Allergies, viruses and acid reflux are treated by controlling symptoms or eliminating the cause. An example might be a cough caused by taking certain blood pressure medications. You stop the cough by changing the medication. Another example might be a cough caused by acid reflux. Controlling the reflux helps control the cough.  Based on your presentation I believe you most likely have A cough due to bacteria.  When patients have a fever and a productive cough with a change in color or increased sputum production, we are concerned about bacterial bronchitis.  If left untreated it can progress to pneumonia.  If your symptoms do not improve with your treatment plan it is important that you contact your provider.   I have prescribed Azithromyin 250 mg: two tables now and then one tablet daily for 4 additonal days   In addition you may use A non-prescription cough medication called Mucinex DM: take 2 tablets every 12 hours.    HOME CARE . Only take medications as instructed by your medical team. . Complete the entire course of an antibiotic. . Drink plenty of fluids and get plenty of rest. . Avoid close contacts especially the very young and the elderly . Cover your mouth if you cough or cough into your sleeve. . Always remember to wash your hands . A steam or ultrasonic humidifier can help congestion.    GET HELP RIGHT AWAY IF: . You develop worsening fever. . You become short of breath . You cough up blood. . Your symptoms persist after you have completed your treatment plan MAKE SURE YOU   Understand  these instructions.  Will watch your condition.  Will get help right away if you are not doing well or get worse.  Your e-visit answers were reviewed by a board certified advanced clinical practitioner to complete your personal care plan.  Depending on the condition, your plan could have included both over the counter or prescription medications. If there is a problem please reply  once you have received a response from your provider. Your safety is important to [place].  If you have drug allergies check your prescription carefully.    You can use MyChart to ask questions about today's visit, request a non-urgent call back, or ask for a work or school excuse for 24 hours related to this e-Visit. If it has been greater than 24 hours you will need to follow up with your provider, or enter a new e-Visit to address those concerns. You will get an e-mail in the next two days asking about your experience.  I hope that your e-visit has been valuable and will speed your recovery. Thank you for using e-visits.

## 2015-08-12 NOTE — Telephone Encounter (Signed)
Dose increased per previous note.

## 2015-08-26 ENCOUNTER — Ambulatory Visit (INDEPENDENT_AMBULATORY_CARE_PROVIDER_SITE_OTHER): Payer: 59 | Admitting: Obstetrics and Gynecology

## 2015-08-26 ENCOUNTER — Encounter: Payer: Self-pay | Admitting: Obstetrics and Gynecology

## 2015-08-26 VITALS — BP 119/75 | HR 67 | Ht 63.0 in | Wt 175.8 lb

## 2015-08-26 DIAGNOSIS — F419 Anxiety disorder, unspecified: Secondary | ICD-10-CM | POA: Insufficient documentation

## 2015-08-26 DIAGNOSIS — F411 Generalized anxiety disorder: Secondary | ICD-10-CM | POA: Diagnosis not present

## 2015-08-26 DIAGNOSIS — R5383 Other fatigue: Secondary | ICD-10-CM

## 2015-08-26 MED ORDER — CYANOCOBALAMIN 1000 MCG/ML IJ SOLN
1000.0000 ug | Freq: Once | INTRAMUSCULAR | Status: AC
  Administered 2015-08-26: 1000 ug via INTRAMUSCULAR

## 2015-08-26 MED ORDER — CYANOCOBALAMIN 1000 MCG/ML IJ SOLN: 1000.0000 ug | Freq: Once | INTRAMUSCULAR | Status: DC

## 2015-08-26 NOTE — Progress Notes (Signed)
    GYNECOLOGY PROGRESS NOTE  Subjective:    Patient ID: Lauren Anthony, female    DOB: 01-23-1971, 45 y.o.   MRN: [id number]  HPI  Patient is a 45 y.o. [phone] female who presents for follow-up of anxiety and mood changes, recently started on anxiolytic medication 6 weeks ago.  Patient notes that overall the medication is working well for her, has helped to stabilize her mood and decrease her anxiety levels significantly.  Patient does report side effects of fatigue and drowsiness.  Notes that she started taking at night to help alleviate the drowsiness, but still feels groggy throughout the day.  Notes low energy when she gets home from work, not motivated to exercise.  Currently taking Celexa 40 mg, wondering if she should decrease dose back down to 20 mg.   The following portions of the patient's history were reviewed and updated as appropriate: allergies, current medications, past family history, past medical history, past social history, past surgical history and problem list.  Review of Systems Pertinent items noted in HPI and remainder of comprehensive ROS otherwise negative.   Objective:   Blood pressure 119/75, pulse 67, height 5' 3"  (1.6 m), weight 175 lb 12.8 oz (79.742 kg), last menstrual period 12/14/2014. General appearance: alert and no distress Exam deferred.    Assessment:   Anxiety disorder with mood changes Fatigue (medication induced)  Plan:   - Notes improvement in anxiety and mood on Celexa. Discussion had on management options for fatigue.  Will attempt to decrease to 20 mg, and continue taking in the evening.  Notes that she already consumes coffee during the day and takes a multivitamin and an oral B12 supplement. Informed that typically side effects should improve over time (i.e. Within 3 months), however if continues to note no improvement, can switch to a less sedating medication.    - Desires to try a B12 injection as she has heard mixed results regarding  energy levels on B12.  Patient administered B12 injection today.     - To f/u in 1 month to reassess symptoms.    A total of 15 minutes were spent face-to-face with the patient during this encounter and over half of that time dealt with counseling and coordination of care.  Rubie Maid, MD [facility]

## 2015-09-09 ENCOUNTER — Encounter: Payer: Self-pay | Admitting: Obstetrics and Gynecology

## 2015-09-11 ENCOUNTER — Encounter: Payer: Self-pay | Admitting: Obstetrics and Gynecology

## 2015-09-15 MED ORDER — BUPROPION HCL 100 MG PO TABS: 100.0000 mg | ORAL_TABLET | Freq: Two times a day (BID) | ORAL | Status: DC

## 2015-09-18 ENCOUNTER — Telehealth: Payer: 59 | Admitting: Family

## 2015-09-18 DIAGNOSIS — J019 Acute sinusitis, unspecified: Secondary | ICD-10-CM | POA: Diagnosis not present

## 2015-09-18 MED ORDER — AMOXICILLIN-POT CLAVULANATE 875-125 MG PO TABS: 1.0000 | ORAL_TABLET | Freq: Two times a day (BID) | ORAL | Status: DC

## 2015-09-18 NOTE — Progress Notes (Signed)

## 2015-09-24 ENCOUNTER — Ambulatory Visit: Payer: 59 | Admitting: Obstetrics and Gynecology

## 2015-10-15 ENCOUNTER — Ambulatory Visit: Payer: 59 | Admitting: Obstetrics and Gynecology

## 2015-10-23 ENCOUNTER — Telehealth: Payer: 59 | Admitting: Nurse Practitioner

## 2015-10-23 DIAGNOSIS — J0100 Acute maxillary sinusitis, unspecified: Secondary | ICD-10-CM | POA: Diagnosis not present

## 2015-10-23 MED ORDER — DOXYCYCLINE HYCLATE 100 MG PO TABS: 100.0000 mg | ORAL_TABLET | Freq: Two times a day (BID) | ORAL | Status: DC

## 2015-10-23 NOTE — Addendum Note (Signed)
Addended by: Chevis Pretty on: 10/23/2015 02:11 PM   Modules accepted: Orders

## 2015-10-23 NOTE — Progress Notes (Signed)

## 2015-10-27 ENCOUNTER — Other Ambulatory Visit: Payer: Self-pay | Admitting: Obstetrics and Gynecology

## 2015-10-27 DIAGNOSIS — Z1231 Encounter for screening mammogram for malignant neoplasm of breast: Secondary | ICD-10-CM

## 2015-10-28 ENCOUNTER — Encounter: Payer: Self-pay | Admitting: Obstetrics and Gynecology

## 2015-11-03 ENCOUNTER — Other Ambulatory Visit: Payer: Self-pay | Admitting: Obstetrics and Gynecology

## 2015-11-03 MED ORDER — BUSPIRONE HCL 7.5 MG PO TABS: 7.5000 mg | ORAL_TABLET | Freq: Two times a day (BID) | ORAL | Status: DC

## 2015-11-11 ENCOUNTER — Other Ambulatory Visit: Payer: Self-pay | Admitting: Obstetrics and Gynecology

## 2015-11-11 ENCOUNTER — Ambulatory Visit
Admission: RE | Admit: 2015-11-11 | Discharge: 2015-11-11 | Disposition: A | Discharge status: Home / Self Care | Payer: 59 | Source: Ambulatory Visit | Attending: Obstetrics and Gynecology | Admitting: Obstetrics and Gynecology

## 2015-11-11 DIAGNOSIS — Z1231 Encounter for screening mammogram for malignant neoplasm of breast: Secondary | ICD-10-CM | POA: Diagnosis not present

## 2016-01-27 ENCOUNTER — Telehealth: Payer: 59 | Admitting: Family

## 2016-01-27 DIAGNOSIS — J208 Acute bronchitis due to other specified organisms: Secondary | ICD-10-CM

## 2016-01-27 MED ORDER — BENZONATATE 100 MG PO CAPS: 100.0000 mg | ORAL_CAPSULE | Freq: Two times a day (BID) | ORAL | 0 refills | Status: DC | PRN

## 2016-01-27 NOTE — Progress Notes (Signed)
We are sorry that you are not feeling well.  Here is how we plan to help!  Based on what you have shared with me it looks like you have upper respiratory tract inflammation that has resulted in a significant cough.  Inflammation and infection in the upper respiratory tract is commonly called bronchitis and has four common causes:  Allergies, Viral Infections, Acid Reflux and Bacterial Infections.  Allergies, viruses and acid reflux are treated by controlling symptoms or eliminating the cause. An example might be a cough caused by taking certain blood pressure medications. You stop the cough by changing the medication. Another example might be a cough caused by acid reflux. Controlling the reflux helps control the cough.  Based on your presentation I believe you most likely have A cough due to a virus.  This is called viral bronchitis and is best treated by rest, plenty of fluids and control of the cough.  You may use Ibuprofen or Tylenol as directed to help your symptoms.    In addition you may use A non-prescription cough medication called Robitussin DAC. Take 2 teaspoons every 8 hours or Delsym: take 2 teaspoons every 12 hours., A non-prescription cough medication called Mucinex DM: take 2 tablets every 12 hours. and A prescription cough medication called Tessalon Perles 163m. You may take 1-2 capsules every 8 hours as needed for your cough.    HOME CARE . Only take medications as instructed by your medical team. . Complete the entire course of an antibiotic. . Drink plenty of fluids and get plenty of rest. . Avoid close contacts especially the very young and the elderly . Cover your mouth if you cough or cough into your sleeve. . Always remember to wash your hands . A steam or ultrasonic humidifier can help congestion.    GET HELP RIGHT AWAY IF: . You develop worsening fever. . You become short of breath . You cough up blood. . Your symptoms persist after you have completed your treatment  plan MAKE SURE YOU   Understand these instructions.  Will watch your condition.  Will get help right away if you are not doing well or get worse.  Your e-visit answers were reviewed by a board certified advanced clinical practitioner to complete your personal care plan.  Depending on the condition, your plan could have included both over the counter or prescription medications. If there is a problem please reply  once you have received a response from your provider. Your safety is important to [place]  If you have drug allergies check your prescription carefully.    You can use MyChart to ask questions about today's visit, request a non-urgent call back, or ask for a work or school excuse for 24 hours related to this e-Visit. If it has been greater than 24 hours you will need to follow up with your provider, or enter a new e-Visit to address those concerns. You will get an e-mail in the next two days asking about your experience.  I hope that your e-visit has been valuable and will speed your recovery. Thank you for using e-visits.

## 2016-02-23 ENCOUNTER — Telehealth: Payer: 59 | Admitting: Family

## 2016-02-23 DIAGNOSIS — J019 Acute sinusitis, unspecified: Secondary | ICD-10-CM

## 2016-02-23 DIAGNOSIS — B9689 Other specified bacterial agents as the cause of diseases classified elsewhere: Secondary | ICD-10-CM

## 2016-02-23 MED ORDER — AMOXICILLIN-POT CLAVULANATE 875-125 MG PO TABS: 1.0000 | ORAL_TABLET | Freq: Two times a day (BID) | ORAL | 0 refills | Status: DC

## 2016-02-23 NOTE — Progress Notes (Signed)

## 2016-03-10 ENCOUNTER — Telehealth: Payer: 59 | Admitting: Physician Assistant

## 2016-03-10 DIAGNOSIS — J019 Acute sinusitis, unspecified: Secondary | ICD-10-CM

## 2016-03-10 DIAGNOSIS — B9689 Other specified bacterial agents as the cause of diseases classified elsewhere: Secondary | ICD-10-CM | POA: Diagnosis not present

## 2016-03-10 MED ORDER — DOXYCYCLINE HYCLATE 100 MG PO CAPS: 100.0000 mg | ORAL_CAPSULE | Freq: Two times a day (BID) | ORAL | 0 refills | Status: DC

## 2016-03-10 NOTE — Progress Notes (Signed)
We are sorry that you are not feeling well.  Here is how we plan to help!  Based on what you have shared with me it looks like you have sinusitis.  Sinusitis is inflammation and infection in the sinus cavities of the head.  Based on your presentation I believe you most likely have Acute Bacterial Sinusitis.  This is an infection caused by bacteria and is treated with antibiotics. I have prescribed Doxycycline 142m by mouth twice a day for 10 days. You may use an oral decongestant such as Mucinex D or if you have glaucoma or high blood pressure use plain Mucinex. Saline nasal spray help and can safely be used as often as needed for congestion.  If you develop worsening sinus pain, fever or notice severe headache and vision changes, or if symptoms are not better after completion of antibiotic, please schedule an appointment with a health care provider.    Sinus infections are not as easily transmitted as other respiratory infection, however we still recommend that you avoid close contact with loved ones, especially the very young and elderly.  Remember to wash your hands thoroughly throughout the day as this is the number one way to prevent the spread of infection!  Home Care:  Only take medications as instructed by your medical team.  Complete the entire course of an antibiotic.  Do not take these medications with alcohol.  A steam or ultrasonic humidifier can help congestion.  You can place a towel over your head and breathe in the steam from hot water coming from a faucet.  Avoid close contacts especially the very young and the elderly.  Cover your mouth when you cough or sneeze.  Always remember to wash your hands.  Get Help Right Away If:  You develop worsening fever or sinus pain.  You develop a severe head ache or visual changes.  Your symptoms persist after you have completed your treatment plan.  Make sure you  Understand these instructions.  Will watch your  condition.  Will get help right away if you are not doing well or get worse.  Your e-visit answers were reviewed by a board certified advanced clinical practitioner to complete your personal care plan.  Depending on the condition, your plan could have included both over the counter or prescription medications.  If there is a problem please reply  once you have received a response from your provider.  Your safety is important to [place]  If you have drug allergies check your prescription carefully.    You can use MyChart to ask questions about today's visit, request a non-urgent call back, or ask for a work or school excuse for 24 hours related to this e-Visit. If it has been greater than 24 hours you will need to follow up with your provider, or enter a new e-Visit to address those concerns.  You will get an e-mail in the next two days asking about your experience.  I hope that your e-visit has been valuable and will speed your recovery. Thank you for using e-visits.

## 2016-03-14 ENCOUNTER — Telehealth: Payer: 59 | Admitting: Nurse Practitioner

## 2016-03-14 DIAGNOSIS — N898 Other specified noninflammatory disorders of vagina: Secondary | ICD-10-CM

## 2016-03-14 MED ORDER — FLUCONAZOLE 150 MG PO TABS: 150.0000 mg | ORAL_TABLET | Freq: Once | ORAL | 0 refills | Status: AC

## 2016-03-14 NOTE — Progress Notes (Signed)
We are sorry that you are not feeling well. Here is how we plan to help! Based on what you shared with me it looks like you: May have a yeast vaginosis  Vaginosis is an inflammation of the vagina that can result in discharge, itching and pain. The cause is usually a change in the normal balance of vaginal bacteria or an infection. Vaginosis can also result from reduced estrogen levels after menopause.  The most common causes of vaginosis are:   Bacterial vaginosis which results from an overgrowth of one on several organisms that are normally present in your vagina.   Yeast infections which are caused by a naturally occurring fungus called candida.   Vaginal atrophy (atrophic vaginosis) which results from the thinning of the vagina from reduced estrogen levels after menopause.   Trichomoniasis which is caused by a parasite and is commonly transmitted by sexual intercourse.  Factors that increase your risk of developing vaginosis include: Marland Kitchen Medications, such as antibiotics and steroids . Uncontrolled diabetes . Use of hygiene products such as bubble bath, vaginal spray or vaginal deodorant . Douching . Wearing damp or tight-fitting clothing . Using an intrauterine device (IUD) for birth control . Hormonal changes, such as those associated with pregnancy, birth control pills or menopause . Sexual activity . Having a sexually transmitted infection  Your treatment plan is A single Diflucan (fluconazole) 153m tablet once.  I have electronically sent this prescription into the pharmacy that you have chosen.  Be sure to take all of the medication as directed. Stop taking any medication if you develop a rash, tongue swelling or shortness of breath. Mothers who are breast feeding should consider pumping and discarding their breast milk while on these antibiotics. However, there is no consensus that infant exposure at these doses would be harmful.  Remember that medication creams can weaken latex  condoms. .Marland Kitchen  HOME CARE:  Good hygiene may prevent some types of vaginosis from recurring and may relieve some symptoms:  . Avoid baths, hot tubs and whirlpool spas. Rinse soap from your outer genital area after a shower, and dry the area well to prevent irritation. Don't use scented or harsh soaps, such as those with deodorant or antibacterial action. .Marland KitchenAvoid irritants. These include scented tampons and pads. . Wipe from front to back after using the toilet. Doing so avoids spreading fecal bacteria to your vagina.  Other things that may help prevent vaginosis include:  .Marland KitchenDon't douche. Your vagina doesn't require cleansing other than normal bathing. Repetitive douching disrupts the normal organisms that reside in the vagina and can actually increase your risk of vaginal infection. Douching won't clear up a vaginal infection. . Use a latex condom. Both female and female latex condoms may help you avoid infections spread by sexual contact. . Wear cotton underwear. Also wear pantyhose with a cotton crotch. If you feel comfortable without it, skip wearing underwear to bed. Yeast thrives in mCampbell SoupYour symptoms should improve in the next day or two.  GET HELP RIGHT AWAY IF:  . You have pain in your lower abdomen ( pelvic area or over your ovaries) . You develop nausea or vomiting . You develop a fever . Your discharge changes or worsens . You have persistent pain with intercourse . You develop shortness of breath, a rapid pulse, or you faint.  These symptoms could be signs of problems or infections that need to be evaluated by a medical provider now.  MAKE SURE YOU  Understand these instructions.  Will watch your condition.  Will get help right away if you are not doing well or get worse.  Your e-visit answers were reviewed by a board certified advanced clinical practitioner to complete your personal care plan. Depending upon the condition, your plan could have included  both over the counter or prescription medications. Please review your pharmacy choice to make sure that you have choses a pharmacy that is open for you to pick up any needed prescription, Your safety is important to [place]. If you have drug allergies check your prescription carefully.   You can use MyChart to ask questions about today's visit, request a non-urgent call back, or ask for a work or school excuse for 24 hours related to this e-Visit. If it has been greater than 24 hours you will need to follow up with your provider, or enter a new e-Visit to address those concerns. You will get a MyChart message within the next two days asking about your experience. I hope that your e-visit has been valuable and will speed your recovery.

## 2016-05-05 ENCOUNTER — Telehealth: Payer: 59 | Admitting: Physician Assistant

## 2016-05-05 DIAGNOSIS — J029 Acute pharyngitis, unspecified: Secondary | ICD-10-CM

## 2016-05-05 MED ORDER — FLUTICASONE PROPIONATE 50 MCG/ACT NA SUSP: 2.0000 | Freq: Every day | NASAL | 6 refills | Status: DC

## 2016-05-05 NOTE — Progress Notes (Signed)
Based on what you shared with me it looks like you have a condition that should be evaluated in a face to face office visit.  NOTE: Even if you have entered your credit card information for this eVisit, you will not be charged.   It seems you do have symptoms that could be indicative of strep throat, especially giving you have a known exposure. Unfortunately diagnosis of strep throat requires physical examination and strep testing. We cannot diagnose or treat strep throat via e-visit. Please stay well-hydrated. Use tylenol for throat pain or fever. Please contact your primary care provider or go to the nearest Urgent care for examination, testing and treatment.   If you are having a true medical emergency please call 911.  If you need an urgent face to face visit, [place] has four urgent care centers for your convenience.  If you need care fast and have a high deductible or no insurance consider:   [email or web address]  [phone]  [address] [place], [place] [address] 8 am to 8 pm Monday-Friday 10 am to 4 pm Saturday-Sunday   The following sites will take your  insurance:    . [facility] [place] a Provider at this Location  [address] [place], [place] [address] . 10 am to 8 pm Monday-Friday . 12 pm to 8 pm Saturday-Sunday   . [facility] at [place] a Provider at this Location  [address] [place], [place], [place] [address] . 8 am to 8 pm Monday-Friday . 9 am to 6 pm Saturday . 11 am to 6 pm Sunday   . [facility] at [place] Get Driving Directions  [address], [place] [address] . 8 am to 8 pm Monday-Friday . 8 am to 4 pm Saturday-Sunday   . [facility] (walk-ins welcome, or call for a scheduled time)  [phone]  Get  Driving Directions Find a Provider at this Location  [place], [place] [address] . 8 am to 8:30 pm Monday-Thursday . 8 am to 6 pm Friday . 8 am to 4 pm Saturday-Sunday   Your e-visit answers were reviewed by a board certified advanced clinical practitioner to complete your personal care plan.  Thank you for using e-Visits.

## 2016-05-05 NOTE — Addendum Note (Signed)
Addended by: Raiford Noble on: 05/05/2016 09:35 AM   Modules accepted: Orders

## 2016-05-12 ENCOUNTER — Ambulatory Visit: Payer: Self-pay | Admitting: Physician Assistant

## 2016-05-12 ENCOUNTER — Encounter: Payer: Self-pay | Admitting: Physician Assistant

## 2016-05-12 VITALS — BP 110/72 | HR 67 | Temp 98.7°F

## 2016-05-12 DIAGNOSIS — J069 Acute upper respiratory infection, unspecified: Secondary | ICD-10-CM

## 2016-05-12 MED ORDER — PREDNISONE 10 MG PO TABS: 30.0000 mg | ORAL_TABLET | Freq: Every day | ORAL | 0 refills | Status: DC

## 2016-05-12 NOTE — Progress Notes (Signed)
S: C/o runny nose and congestion for 6 days, no fever, chills, cp/sob, v/d; mucus is clear throughout the day, cough is sporadic, worse at night, throat is sore, worse at night, no known fever but feels hot at night  Using otc meds: flonase, mucinex dm but has not taken in 4 days  O: PE: vitals wnl, nad, perrl eomi, normocephalic, tms dull, nasal mucosa red and swollen, throat injected, neck supple no lymph, lungs c t a, cv rrr, neuro intact  A:  Acute viral uri   P: drink fluids, continue regular meds , use otc meds of choice, return if not improving in 5 days, return earlier if worsening , prednisone 30 mg qd x 3d, if not better by Monday will call in an antibiotic

## 2016-08-16 ENCOUNTER — Telehealth: Payer: 59 | Admitting: Nurse Practitioner

## 2016-08-16 DIAGNOSIS — J209 Acute bronchitis, unspecified: Secondary | ICD-10-CM

## 2016-08-16 MED ORDER — BENZONATATE 100 MG PO CAPS: 100.0000 mg | ORAL_CAPSULE | Freq: Two times a day (BID) | ORAL | 0 refills | Status: AC | PRN

## 2016-08-16 MED ORDER — FLUTICASONE PROPIONATE 50 MCG/ACT NA SUSP: 2.0000 | Freq: Every day | NASAL | 0 refills | Status: DC

## 2016-08-16 NOTE — Progress Notes (Signed)
We are sorry that you are not feeling well.  Here is how we plan to help!  Based on what you have shared with me it looks like you have upper respiratory tract inflammation that has resulted in a significant cough.  Inflammation and infection in the upper respiratory tract is commonly called bronchitis and has four common causes:  Allergies, Viral Infections, Acid Reflux and Bacterial Infections.  Allergies, viruses and acid reflux are treated by controlling symptoms or eliminating the cause. An example might be a cough caused by taking certain blood pressure medications. You stop the cough by changing the medication. Another example might be a cough caused by acid reflux. Controlling the reflux helps control the cough.  Based on your presentation I believe you most likely have A cough due to a virus.  This is called viral bronchitis and is best treated by rest, plenty of fluids and control of the cough.  You may use Ibuprofen or Tylenol as directed to help your symptoms.     In addition you may use A prescription cough medication called Tessalon Perles 160m. You may take 1-2 capsules every 8 hours as needed for your cough.  I am also prescribing Fluticasone Nasal spray, which is a steroid.  You will use administer 2 sprays in each nostril daily for 10 days.    USE OF BRONCHODILATOR ("RESCUE") INHALERS: There is a risk from using your bronchodilator too frequently.  The risk is that over-reliance on a medication which only relaxes the muscles surrounding the breathing tubes can reduce the effectiveness of medications prescribed to reduce swelling and congestion of the tubes themselves.  Although you feel brief relief from the bronchodilator inhaler, your asthma may actually be worsening with the tubes becoming more swollen and filled with mucus.  This can delay other crucial treatments, such as oral steroid medications. If you need to use a bronchodilator inhaler daily, several times per day, you should  discuss this with your provider.  There are probably better treatments that could be used to keep your asthma under control.     HOME CARE . Only take medications as instructed by your medical team. . Complete the entire course of an antibiotic. . Drink plenty of fluids and get plenty of rest. . Avoid close contacts especially the very young and the elderly . Cover your mouth if you cough or cough into your sleeve. . Always remember to wash your hands . A steam or ultrasonic humidifier can help congestion.   GET HELP RIGHT AWAY IF: . You develop worsening fever. . You become short of breath . You cough up blood. . Your symptoms persist after you have completed your treatment plan MAKE SURE YOU   Understand these instructions.  Will watch your condition.  Will get help right away if you are not doing well or get worse.  Your e-visit answers were reviewed by a board certified advanced clinical practitioner to complete your personal care plan.  Depending on the condition, your plan could have included both over the counter or prescription medications. If there is a problem please reply  once you have received a response from your provider. Your safety is important to [place]  If you have drug allergies check your prescription carefully.    You can use MyChart to ask questions about today's visit, request a non-urgent call back, or ask for a work or school excuse for 24 hours related to this e-Visit. If it has been greater than 24 hours  you will need to follow up with your provider, or enter a new e-Visit to address those concerns. You will get an e-mail in the next two days asking about your experience.  I hope that your e-visit has been valuable and will speed your recovery. Thank you for using e-visits.

## 2016-08-17 ENCOUNTER — Ambulatory Visit: Payer: Self-pay | Admitting: Physician Assistant

## 2016-08-17 ENCOUNTER — Encounter: Payer: Self-pay | Admitting: Physician Assistant

## 2016-08-17 VITALS — BP 128/84 | HR 91 | Temp 99.9°F

## 2016-08-17 DIAGNOSIS — J101 Influenza due to other identified influenza virus with other respiratory manifestations: Secondary | ICD-10-CM

## 2016-08-17 DIAGNOSIS — R509 Fever, unspecified: Secondary | ICD-10-CM

## 2016-08-17 LAB — POCT INFLUENZA A/B
INFLUENZA A, POC: NEGATIVE
INFLUENZA B, POC: POSITIVE — AB

## 2016-08-17 MED ORDER — OSELTAMIVIR PHOSPHATE 75 MG PO CAPS: 75.0000 mg | ORAL_CAPSULE | Freq: Two times a day (BID) | ORAL | 0 refills | Status: DC

## 2016-08-17 MED ORDER — HYDROCOD POLST-CPM POLST ER 10-8 MG/5ML PO SUER: 5.0000 mL | Freq: Two times a day (BID) | ORAL | 0 refills | Status: DC | PRN

## 2016-08-17 NOTE — Progress Notes (Signed)
S: C/o sore throat, ear pain, dry cough, and aches for 3 days, + fever, chills that started last night, denies cp/sob, v/d; states she had an emergency root canal last week, was taking amoxil until Monday for the infection, started feeling bad this weekend; did an e-visit last night and they gave her tessalon perls and flonase Using otc meds:   O: PE: vitals wnl, nad,  perrl eomi, normocephalic, tms dull, nasal mucosa red and swollen, throat injected, neck supple no lymph, lungs c t a, cv rrr, neuro intact, flu swab +B  A:  Acute influenza B   P: drink fluids, continue regular meds , use otc meds of choice, return if not improving in 5 days, return earlier if worsening , tamiflu , tussionex, rx for her husband [name] tamiflu 1 po qd x 10d

## 2016-11-02 ENCOUNTER — Telehealth: Payer: 59 | Admitting: Nurse Practitioner

## 2016-11-02 DIAGNOSIS — N3000 Acute cystitis without hematuria: Secondary | ICD-10-CM

## 2016-11-02 MED ORDER — NITROFURANTOIN MONOHYD MACRO 100 MG PO CAPS: 100.0000 mg | ORAL_CAPSULE | Freq: Two times a day (BID) | ORAL | 0 refills | Status: DC

## 2016-11-02 NOTE — Progress Notes (Signed)
We are sorry that you are not feeling well.  Here is how we plan to help!  Based on what you shared with me it looks like you most likely have a simple urinary tract infection.  A UTI (Urinary Tract Infection) is a bacterial infection of the bladder.  Most cases of urinary tract infections are simple to treat but a key part of your care is to encourage you to drink plenty of fluids and watch your symptoms carefully.  I have prescribed MacroBid 100 mg twice a day for 5 days.  Your symptoms should gradually improve. Call us if the burning in your urine worsens, you develop worsening fever, back pain or pelvic pain or if your symptoms do not resolve after completing the antibiotic.  Urinary tract infections can be prevented by drinking plenty of water to keep your body hydrated.  Also be sure when you wipe, wipe from front to back and don't hold it in!  If possible, empty your bladder every 4 hours.  Your e-visit answers were reviewed by a board certified advanced clinical practitioner to complete your personal care plan.  Depending on the condition, your plan could have included both over the counter or prescription medications.  If there is a problem please reply  once you have received a response from your provider.  Your safety is important to [place].  If you have drug allergies check your prescription carefully.    You can use MyChart to ask questions about today's visit, request a non-urgent call back, or ask for a work or school excuse for 24 hours related to this e-Visit. If it has been greater than 24 hours you will need to follow up with your provider, or enter a new e-Visit to address those concerns.   You will get an e-mail in the next two days asking about your experience.  I hope that your e-visit has been valuable and will speed your recovery. Thank you for using e-visits.

## 2016-11-03 ENCOUNTER — Other Ambulatory Visit: Payer: Self-pay | Admitting: Obstetrics and Gynecology

## 2016-11-03 DIAGNOSIS — Z1231 Encounter for screening mammogram for malignant neoplasm of breast: Secondary | ICD-10-CM

## 2016-11-22 ENCOUNTER — Ambulatory Visit
Admission: RE | Admit: 2016-11-22 | Discharge: 2016-11-22 | Disposition: A | Discharge status: Home / Self Care | Payer: 59 | Source: Ambulatory Visit | Attending: Obstetrics and Gynecology | Admitting: Obstetrics and Gynecology

## 2016-11-22 DIAGNOSIS — Z1231 Encounter for screening mammogram for malignant neoplasm of breast: Secondary | ICD-10-CM | POA: Insufficient documentation

## 2016-12-22 ENCOUNTER — Telehealth: Payer: 59 | Admitting: Family

## 2016-12-22 DIAGNOSIS — J019 Acute sinusitis, unspecified: Secondary | ICD-10-CM

## 2016-12-22 MED ORDER — FLUTICASONE PROPIONATE 50 MCG/ACT NA SUSP: 2.0000 | Freq: Two times a day (BID) | NASAL | 6 refills | Status: DC

## 2016-12-22 NOTE — Progress Notes (Signed)
Thank you for the details you put in the comment boxes. Those details really help [place] take better care of you. This is likely viral in origin (nearly 90% of cases are viral).   We are sorry that you are not feeling well.  Here is how we plan to help!  Based on what you have shared with me it looks like you have sinusitis.  Sinusitis is inflammation and infection in the sinus cavities of the head.  Based on your presentation I believe you most likely have Acute Viral Sinusitis.This is an infection most likely caused by a virus. There is not specific treatment for viral sinusitis other than to help you with the symptoms until the infection runs its course.  You may use an oral decongestant such as Mucinex D or if you have glaucoma or high blood pressure use plain Mucinex. Saline nasal spray help and can safely be used as often as needed for congestion, I have prescribed: Fluticasone nasal spray two sprays in each nostril twice a day  Some authorities believe that zinc sprays or the use of Echinacea may shorten the course of your symptoms.  Sinus infections are not as easily transmitted as other respiratory infection, however we still recommend that you avoid close contact with loved ones, especially the very young and elderly.  Remember to wash your hands thoroughly throughout the day as this is the number one way to prevent the spread of infection!  Home Care:  Only take medications as instructed by your medical team.  Complete the entire course of an antibiotic.  Do not take these medications with alcohol.  A steam or ultrasonic humidifier can help congestion.  You can place a towel over your head and breathe in the steam from hot water coming from a faucet.  Avoid close contacts especially the very young and the elderly.  Cover your mouth when you cough or sneeze.  Always remember to wash your hands.  Get Help Right Away If:  You develop worsening fever or sinus pain.  You develop a  severe head ache or visual changes.  Your symptoms persist after you have completed your treatment plan.  Make sure you  Understand these instructions.  Will watch your condition.  Will get help right away if you are not doing well or get worse.  Your e-visit answers were reviewed by a board certified advanced clinical practitioner to complete your personal care plan.  Depending on the condition, your plan could have included both over the counter or prescription medications.  If there is a problem please reply  once you have received a response from your provider.  Your safety is important to [place].  If you have drug allergies check your prescription carefully.    You can use MyChart to ask questions about today's visit, request a non-urgent call back, or ask for a work or school excuse for 24 hours related to this e-Visit. If it has been greater than 24 hours you will need to follow up with your provider, or enter a new e-Visit to address those concerns.  You will get an e-mail in the next two days asking about your experience.  I hope that your e-visit has been valuable and will speed your recovery. Thank you for using e-visits.

## 2017-05-09 ENCOUNTER — Telehealth: Payer: 59 | Admitting: Nurse Practitioner

## 2017-05-09 DIAGNOSIS — J019 Acute sinusitis, unspecified: Secondary | ICD-10-CM

## 2017-05-09 MED ORDER — FLUTICASONE PROPIONATE 50 MCG/ACT NA SUSP: 2.0000 | Freq: Two times a day (BID) | NASAL | 6 refills | Status: DC

## 2017-05-09 NOTE — Progress Notes (Signed)
We are sorry that you are not feeling well.  Here is how we plan to help!  Based on what you have shared with me it looks like you have sinusitis.  Sinusitis is inflammation and infection in the sinus cavities of the head.  Based on your presentation I believe you most likely have Acute Viral Sinusitis.This is an infection most likely caused by a virus. There is not specific treatment for viral sinusitis other than to help you with the symptoms until the infection runs its course.  You may use an oral decongestant such as Mucinex D or if you have glaucoma or high blood pressure use plain Mucinex. Saline nasal spray help and can safely be used as often as needed for congestion, I have prescribed: Fluticasone nasal spray two sprays in each nostril once a day  Some authorities believe that zinc sprays or the use of Echinacea may shorten the course of your symptoms.  Sinus infections are not as easily transmitted as other respiratory infection, however we still recommend that you avoid close contact with loved ones, especially the very young and elderly.  Remember to wash your hands thoroughly throughout the day as this is the number one way to prevent the spread of infection!  Home Care:  Only take medications as instructed by your medical team.  Complete the entire course of an antibiotic.  Do not take these medications with alcohol.  A steam or ultrasonic humidifier can help congestion.  You can place a towel over your head and breathe in the steam from hot water coming from a faucet.  Avoid close contacts especially the very young and the elderly.  Cover your mouth when you cough or sneeze.  Always remember to wash your hands.  Get Help Right Away If:  You develop worsening fever or sinus pain.  You develop a severe head ache or visual changes.  Your symptoms persist after you have completed your treatment plan.  Make sure you  Understand these instructions.  Will watch your  condition.  Will get help right away if you are not doing well or get worse.  Your e-visit answers were reviewed by a board certified advanced clinical practitioner to complete your personal care plan.  Depending on the condition, your plan could have included both over the counter or prescription medications.  If there is a problem please reply  once you have received a response from your provider.  Your safety is important to [place].  If you have drug allergies check your prescription carefully.    You can use MyChart to ask questions about today's visit, request a non-urgent call back, or ask for a work or school excuse for 24 hours related to this e-Visit. If it has been greater than 24 hours you will need to follow up with your provider, or enter a new e-Visit to address those concerns.  You will get an e-mail in the next two days asking about your experience.  I hope that your e-visit has been valuable and will speed your recovery. Thank you for using e-visits.

## 2017-05-17 ENCOUNTER — Telehealth: Payer: 59 | Admitting: Nurse Practitioner

## 2017-05-17 DIAGNOSIS — J0101 Acute recurrent maxillary sinusitis: Secondary | ICD-10-CM | POA: Diagnosis not present

## 2017-05-17 MED ORDER — AMOXICILLIN-POT CLAVULANATE 875-125 MG PO TABS
1.0000 | ORAL_TABLET | Freq: Two times a day (BID) | ORAL | 0 refills | Status: DC
Start: 2017-05-17 — End: 2017-09-14

## 2017-05-17 NOTE — Progress Notes (Signed)

## 2017-09-01 ENCOUNTER — Telehealth: Payer: Self-pay | Admitting: Obstetrics and Gynecology

## 2017-09-01 NOTE — Telephone Encounter (Signed)
Please inform patient that my earliest next available that I can work her in is May 9th at 9:00.

## 2017-09-01 NOTE — Telephone Encounter (Signed)
The patient called and stated that she would like to speak to Dr. Marcelline Mates in regards to her needing a Physical Exam sooner than one is available. At the time of the call I offered the patient the next available opening on 10/05/17 @ 8 am, The patient declined and stated "Dr. Marcelline Mates knows me, and she can get me in sooner." The patient would like to speak with Dr. Marcelline Mates. Please advise.

## 2017-09-01 NOTE — Telephone Encounter (Signed)
Pt was called and informed that she could come in on May 9th at [place] and she was okay with that date.

## 2017-09-14 ENCOUNTER — Ambulatory Visit (INDEPENDENT_AMBULATORY_CARE_PROVIDER_SITE_OTHER): Payer: 59 | Admitting: Obstetrics and Gynecology

## 2017-09-14 ENCOUNTER — Encounter: Payer: Self-pay | Admitting: Obstetrics and Gynecology

## 2017-09-14 VITALS — BP 113/64 | HR 60 | Ht 62.0 in | Wt 178.2 lb

## 2017-09-14 DIAGNOSIS — E669 Obesity, unspecified: Secondary | ICD-10-CM

## 2017-09-14 DIAGNOSIS — Z13220 Encounter for screening for lipoid disorders: Secondary | ICD-10-CM | POA: Diagnosis not present

## 2017-09-14 DIAGNOSIS — Z1231 Encounter for screening mammogram for malignant neoplasm of breast: Secondary | ICD-10-CM

## 2017-09-14 DIAGNOSIS — Z01419 Encounter for gynecological examination (general) (routine) without abnormal findings: Secondary | ICD-10-CM

## 2017-09-14 NOTE — Progress Notes (Signed)
GYNECOLOGY ANNUAL PHYSICAL EXAM PROGRESS NOTE  Subjective:    Lauren BERKE is a 47 y.o. [id number] female who presents for an annual exam. The patient has no complaints today. The patient is sexually active.  The patient wears seatbelts: yes. The patient participates in regular exercise: yes. Has the patient ever been transfused or tattooed?: no. The patient reports that there is not domestic violence in her life.   Of note, patient states that she is scheduled for elective abdominoplasty surgery at the end of May.    Gynecologic History  Menarche age: 9 Patient's last menstrual period was 12/14/2014.  She is s/p hysterectomy.  Contraception: status post hysterectomy History of STI's: Denies Last Pap: 2016. Results were: normal.  Denies h/o abnormal pap smears. Last mammogram: 11/2016. Results were: normal   OB History  Gravida Para Term Preterm AB Living  3 3 1 2  0 3  SAB TAB Ectopic Multiple Live Births  0 0 0 0 3    # Outcome Date GA Lbr Len/2nd Weight Sex Delivery Anes PTL Lv  3 Preterm 11/21/99 [redacted]w[redacted]d  F CS-Unspec   LIV  2 Preterm 01/03/93 321w0d M VBAC  Y LIV  1 Term 08/12/87    F CS-Unspec   LIV    Past Medical History:  Diagnosis Date  . Cholelithiases 2016  . Heavy periods     Past Surgical History:  Procedure Laterality Date  . BILATERAL SALPINGECTOMY Bilateral 12/22/2014   Procedure: BILATERAL SALPINGECTOMY;  Surgeon: AnRubie MaidMD;  Location: [facility] ORS;  Service: Gynecology;  Laterality: Bilateral;  . CESAREAN SECTION    . CHOLECYSTECTOMY    . CYSTOSCOPY  12/22/2014   Procedure: CYSTOSCOPY;  Surgeon: AnRubie MaidMD;  Location: [facility] ORS;  Service: Gynecology;;  . ENDOMETRIAL ABLATION    . LAPAROSCOPIC GASTRIC SLEEVE RESECTION    . LAPAROSCOPIC HYSTERECTOMY  12/22/2014   Procedure: HYSTERECTOMY TOTAL LAPAROSCOPIC;  Surgeon: AnRubie MaidMD;  Location: [facility] ORS;  Service: Gynecology;;  . NORebbeca PaulBLATION  06/2014    Family History  Problem Relation  Age of Onset  . Cancer Neg Hx   . Heart disease Neg Hx   . Diabetes Neg Hx   . Breast cancer Neg Hx     Social History   Socioeconomic History  . Marital status: Married    Spouse name: Not on file  . Number of children: Not on file  . Years of education: Not on file  . Highest education level: Not on file  Occupational History  . Occupation: [employer]. Financial resource strain: Not on file  . Food insecurity:    Worry: Not on file    Inability: Not on file  . Transportation needs:    Medical: Not on file    Non-medical: Not on file  Tobacco Use  . Smoking status: Never Smoker  . Smokeless tobacco: Never Used  Substance and Sexual Activity  . Alcohol use: Not Currently    Alcohol/week: 0.0 oz    Comment: rare  . Drug use: No  . Sexual activity: Yes    Partners: Male    Birth control/protection: Surgical  Lifestyle  . Physical activity:    Days per week: Not on file    Minutes per session: Not on file  . Stress: Not on file  Relationships  . Social connections:    Talks on phone: Not on file  Gets together: Not on file    Attends religious service: Not on file    Active member of club or organization: Not on file    Attends meetings of clubs or organizations: Not on file    Relationship status: Not on file  . Intimate partner violence:    Fear of current or ex partner: Not on file    Emotionally abused: Not on file    Physically abused: Not on file    Forced sexual activity: Not on file  Other Topics Concern  . Not on file  Social History Narrative  . Not on file    Current Outpatient Medications on File Prior to Visit  Medication Sig Dispense Refill  . fluticasone (FLONASE) 50 MCG/ACT nasal spray Place 2 sprays into both nostrils 2 (two) times daily. For 7 days, then once daily. (Patient not taking: Reported on 09/14/2017) 16 g 6   No current facility-administered medications on file prior to visit.     No Known  Allergies   Review of Systems Constitutional: negative for chills, fatigue, fevers and sweats Eyes: negative for irritation, redness and visual disturbance Ears, nose, mouth, throat, and face: negative for hearing loss, nasal congestion, snoring and tinnitus Respiratory: negative for asthma, cough, sputum Cardiovascular: negative for chest pain, dyspnea, exertional chest pressure/discomfort, irregular heart beat, palpitations and syncope Gastrointestinal: negative for abdominal pain, change in bowel habits, nausea and vomiting Genitourinary: negative for abnormal menstrual periods, genital lesions, sexual problems and vaginal discharge, dysuria and urinary incontinence Integument/breast: negative for breast lump, breast tenderness and nipple discharge Hematologic/lymphatic: negative for bleeding and easy bruising Musculoskeletal:negative for back pain and muscle weakness Neurological: negative for dizziness, headaches, vertigo and weakness Endocrine: negative for diabetic symptoms including polydipsia, polyuria and skin dryness Allergic/Immunologic: negative for hay fever and urticaria        Objective:  Blood pressure 113/64, pulse 60, height 5' 2"  (1.575 m), weight 178 lb 3.2 oz (80.8 kg), last menstrual period 12/14/2014. Body mass index is 32.59 kg/m.  General Appearance:    Alert, cooperative, no distress, appears stated age  Head:    Normocephalic, without obvious abnormality, atraumatic  Eyes:    PERRL, conjunctiva/corneas clear, EOM's intact, both eyes  Ears:    Normal external ear canals, both ears  Nose:   Nares normal, septum midline, mucosa normal, no drainage or sinus tenderness  Throat:   Lips, mucosa, and tongue normal; teeth and gums normal  Neck:   Supple, symmetrical, trachea midline, no adenopathy; thyroid: no enlargement/tenderness/nodules; no carotid bruit or JVD  Back:     Symmetric, no curvature, ROM normal, no CVA tenderness  Lungs:     Clear to auscultation  bilaterally, respirations unlabored  Chest Wall:    No tenderness or deformity   Heart:    Regular rate and rhythm, S1 and S2 normal, no murmur, rub or gallop  Breast Exam:    No tenderness, masses, or nipple abnormality  Abdomen:     Soft, non-tender, bowel sounds active all four quadrants, no masses, no organomegaly.    Genitalia:    Pelvic:external genitalia normal, vagina without lesions, discharge, or tenderness, rectovaginal septum  normal. Cervix normal in appearance, no cervical motion tenderness, no adnexal masses or tenderness.  Uterus normal size, shape, mobile, regular contours, nontender.  Rectal:    Normal external sphincter.  No hemorrhoids appreciated. Internal exam not done.   Extremities:   Extremities normal, atraumatic, no cyanosis or edema  Pulses:   2+ and  symmetric all extremities  Skin:   Skin color, texture, turgor normal, no rashes or lesions  Lymph nodes:   Cervical, supraclavicular, and axillary nodes normal  Neurologic:   CNII-XII intact, normal strength, sensation and reflexes throughout   .  Labs:  Lab Results  Component Value Date   WBC 12.1 (H) 12/23/2014   HGB 11.3 (L) 12/23/2014   HCT 34.1 (L) 12/23/2014   MCV 87.9 12/23/2014   PLT 209 12/23/2014    Lab Results  Component Value Date   CREATININE 0.74 12/23/2014   BUN 14 12/17/2014   NA 136 12/17/2014   K 3.8 12/17/2014   CL 103 12/17/2014   CO2 23 12/17/2014    Lab Results  Component Value Date   ALT 21 12/07/2014   AST 24 12/07/2014   ALKPHOS 57 12/07/2014   BILITOT 0.6 12/07/2014    Lab Results  Component Value Date   TSH 1.620 11/20/2014     Assessment:   Routine gynecologic exam.  Obesity, mild  Plan:     Blood tests: CBC with diff, Comprehensive metabolic panel, Lipoproteins and TSH. Breast self exam technique reviewed and patient encouraged to perform self-exam monthly. Discussed healthy lifestyle modifications. Mammogram up to date. Next due in July. Pap smears no  longer required, has had a hysterectomy and no prior h/o cervical dysplasia.  Follow up in 1 year for annual exam   Rubie Maid, MD [facility]

## 2017-09-14 NOTE — Patient Instructions (Signed)

## 2017-09-15 LAB — CBC
Hematocrit: 39.5 % (ref 34.0–46.6)
Hemoglobin: 13.3 g/dL (ref 11.1–15.9)
MCH: 28.9 pg (ref 26.6–33.0)
MCHC: 33.7 g/dL (ref 31.5–35.7)
MCV: 86 fL (ref 79–97)
PLATELETS: 238 10*3/uL (ref 150–379)
RBC: 4.61 x10E6/uL (ref 3.77–5.28)
RDW: 13.9 % (ref 12.3–15.4)
WBC: 4.5 10*3/uL (ref 3.4–10.8)

## 2017-09-15 LAB — COMPREHENSIVE METABOLIC PANEL
A/G RATIO: 2 (ref 1.2–2.2)
ALT: 13 IU/L (ref 0–32)
AST: 14 IU/L (ref 0–40)
Albumin: 4.4 g/dL (ref 3.5–5.5)
Alkaline Phosphatase: 55 IU/L (ref 39–117)
BILIRUBIN TOTAL: 0.5 mg/dL (ref 0.0–1.2)
BUN/Creatinine Ratio: 15 (ref 9–23)
BUN: 13 mg/dL (ref 6–24)
CALCIUM: 9.4 mg/dL (ref 8.7–10.2)
CO2: 21 mmol/L (ref 20–29)
Chloride: 106 mmol/L (ref 96–106)
Creatinine, Ser: 0.88 mg/dL (ref 0.57–1.00)
GFR calc Af Amer: 91 mL/min/{1.73_m2} (ref >59)
GFR, EST NON AFRICAN AMERICAN: 79 mL/min/{1.73_m2} (ref >59)
Globulin, Total: 2.2 g/dL (ref 1.5–4.5)
Glucose: 86 mg/dL (ref 65–99)
POTASSIUM: 4.2 mmol/L (ref 3.5–5.2)
Sodium: 141 mmol/L (ref 134–144)
Total Protein: 6.6 g/dL (ref 6.0–8.5)

## 2017-09-15 LAB — LIPID PANEL
Chol/HDL Ratio: 2.9 ratio (ref 0.0–4.4)
Cholesterol, Total: 160 mg/dL (ref 100–199)
HDL: 56 mg/dL (ref >39)
LDL Calculated: 91 mg/dL (ref 0–99)
Triglycerides: 66 mg/dL (ref 0–149)
VLDL Cholesterol Cal: 13 mg/dL (ref 5–40)

## 2017-09-15 LAB — TSH: TSH: 2.34 u[IU]/mL (ref 0.450–4.500)

## 2017-09-25 ENCOUNTER — Encounter: Payer: Self-pay | Admitting: Obstetrics and Gynecology

## 2017-09-25 MED ORDER — ALPRAZOLAM 0.5 MG PO TABS: 0.5000 mg | ORAL_TABLET | Freq: Three times a day (TID) | ORAL | 0 refills | Status: DC | PRN

## 2017-12-12 ENCOUNTER — Ambulatory Visit
Admission: RE | Admit: 2017-12-12 | Discharge: 2017-12-12 | Disposition: A | Discharge status: Home / Self Care | Payer: 59 | Source: Ambulatory Visit | Attending: Obstetrics and Gynecology | Admitting: Obstetrics and Gynecology

## 2017-12-12 DIAGNOSIS — Z1231 Encounter for screening mammogram for malignant neoplasm of breast: Secondary | ICD-10-CM | POA: Insufficient documentation

## 2018-07-05 ENCOUNTER — Telehealth: Payer: No Typology Code available for payment source | Admitting: Family

## 2018-07-05 DIAGNOSIS — J069 Acute upper respiratory infection, unspecified: Secondary | ICD-10-CM | POA: Diagnosis not present

## 2018-07-05 MED ORDER — BENZONATATE 100 MG PO CAPS: 100.0000 mg | ORAL_CAPSULE | Freq: Three times a day (TID) | ORAL | 0 refills | Status: DC | PRN

## 2018-07-05 NOTE — Progress Notes (Signed)
Greater than 5 minutes, yet less than 10 minutes of time have been spent researching, coordinating, and implementing care for this patient today.  Thank you for the details you included in the comment boxes. Those details are very helpful in determining the best course of treatment for you and help [place] to provide the best care.  We are sorry you are not feeling well.  Here is how we plan to help!  Based on what you have shared with me, it looks like you may have a viral upper respiratory infection.  Upper respiratory infections are caused by a large number of viruses; however, rhinovirus is the most common cause.   Symptoms vary from person to person, with common symptoms including sore throat, cough, and fatigue or lack of energy.  A low-grade fever of up to 100.4 may present, but is often uncommon.  Symptoms vary however, and are closely related to a person's age or underlying illnesses.  The most common symptoms associated with an upper respiratory infection are nasal discharge or congestion, cough, sneezing, headache and pressure in the ears and face.  These symptoms usually persist for about 3 to 10 days, but can last up to 2 weeks.  It is important to know that upper respiratory infections do not cause serious illness or complications in most cases.    Upper respiratory infections can be transmitted from person to person, with the most common method of transmission being a person's hands.  The virus is able to live on the skin and can infect other persons for up to 2 hours after direct contact.  Also, these can be transmitted when someone coughs or sneezes; thus, it is important to cover the mouth to reduce this risk.  To keep the spread of the illness at [place], good hand hygiene is very important.  This is an infection that is most likely caused by a virus. There are no specific treatments other than to help you with the symptoms until the infection runs its course.  We are sorry you are not feeling  well.  Here is how we plan to help!   For nasal congestion, you may use an oral decongestants such as Mucinex D or if you have glaucoma or high blood pressure use plain Mucinex.  Saline nasal spray or nasal drops can help and can safely be used as often as needed for congestion.  For your congestion, I have prescribed (continue using the Flonase you have)  If you do not have a history of heart disease, hypertension, diabetes or thyroid disease, prostate/bladder issues or glaucoma, you may also use Sudafed to treat nasal congestion.  It is highly recommended that you consult with a pharmacist or your primary care physician to ensure this medication is safe for you to take.     If you have a cough, you may use cough suppressants such as Delsym and Robitussin.  If you have glaucoma or high blood pressure, you can also use Coricidin HBP.   For cough I have prescribed for you A prescription cough medication called Tessalon Perles 100 mg. You may take 1-2 capsules every 8 hours as needed for cough  If you have a sore or scratchy throat, use a saltwater gargle-  to  teaspoon of salt dissolved in a 4-ounce to 8-ounce glass of warm water.  Gargle the solution for approximately 15-30 seconds and then spit.  It is important not to swallow the solution.  You can also use throat lozenges/cough drops and  Chloraseptic spray to help with throat pain or discomfort.  Warm or cold liquids can also be helpful in relieving throat pain.  For headache, pain or general discomfort, you can use Ibuprofen or Tylenol as directed.   Some authorities believe that zinc sprays or the use of Echinacea may shorten the course of your symptoms.   HOME CARE . Only take medications as instructed by your medical team. . Be sure to drink plenty of fluids. Water is fine as well as fruit juices, sodas and electrolyte beverages. You may want to stay away from caffeine or alcohol. If you are nauseated, try taking small sips of liquids. How  do you know if you are getting enough fluid? Your urine should be a pale yellow or almost colorless. . Get rest. . Taking a steamy shower or using a humidifier may help nasal congestion and ease sore throat pain. You can place a towel over your head and breathe in the steam from hot water coming from a faucet. . Using a saline nasal spray works much the same way. . Cough drops, hard candies and sore throat lozenges may ease your cough. . Avoid close contacts especially the very young and the elderly . Cover your mouth if you cough or sneeze . Always remember to wash your hands.   GET HELP RIGHT AWAY IF: . You develop worsening fever. . If your symptoms do not improve within 10 days . You develop yellow or green discharge from your nose over 3 days. . You have coughing fits . You develop a severe head ache or visual changes. . You develop shortness of breath, difficulty breathing or start having chest pain . Your symptoms persist after you have completed your treatment plan  MAKE SURE YOU   Understand these instructions.  Will watch your condition.  Will get help right away if you are not doing well or get worse.  Your e-visit answers were reviewed by a board certified advanced clinical practitioner to complete your personal care plan. Depending upon the condition, your plan could have included both over the counter or prescription medications. Please review your pharmacy choice. If there is a problem, you may call our nursing hot line at and have the prescription routed to another pharmacy. Your safety is important to [place]. If you have drug allergies check your prescription carefully.   You can use MyChart to ask questions about today's visit, request a non-urgent call back, or ask for a work or school excuse for 24 hours related to this e-Visit. If it has been greater than 24 hours you will need to follow up with your provider, or enter a new e-Visit to address those concerns. You will  get an e-mail in the next two days asking about your experience.  I hope that your e-visit has been valuable and will speed your recovery. Thank you for using e-visits.

## 2018-11-13 ENCOUNTER — Telehealth: Payer: Self-pay

## 2018-11-13 NOTE — Telephone Encounter (Signed)
Coronavirus (COVID-19) Are you at risk?  Are you at risk for the Coronavirus (COVID-19)?  To be considered HIGH RISK for Coronavirus (COVID-19), you have to meet the following criteria:  . Traveled to [place], [place], [place], [place] or [place]; or in the [place] to [place], [place], [place], or [place]; and have fever, cough, and shortness of breath within the last 2 weeks of travel OR . Been in close contact with a person diagnosed with COVID-19 within the last 2 weeks and have fever, cough, and shortness of breath . IF YOU DO NOT MEET THESE CRITERIA, YOU ARE CONSIDERED LOW RISK FOR COVID-19.  What to do if you are HIGH RISK for COVID-19?  . If you are having a medical emergency, call 911. . Seek medical care right away. Before you go to a doctor's office, urgent care or emergency department, call ahead and tell them about your recent travel, contact with someone diagnosed with COVID-19, and your symptoms. You should receive instructions from your physician's office regarding next steps of care.  . When you arrive at healthcare provider, tell the healthcare staff immediately you have returned from visiting [place], [place], [place], [place] or [place]; or traveled in the [place] to [place], [place], [place], or [place]; in the last two weeks or you have been in close contact with a person diagnosed with COVID-19 in the last 2 weeks.   . Tell the health care staff about your symptoms: fever, cough and shortness of breath. . After you have been seen by a medical provider, you will be either: o Tested for (COVID-19) and discharged home on quarantine except to seek medical care if symptoms worsen, and asked to  - Stay home and avoid contact with others until you get your results (4-5 days)  - Avoid travel on public transportation if possible (such as bus, train, or airplane) or o Sent to the Emergency Department by EMS for evaluation, COVID-19 testing, and possible  admission depending on your condition and test results.  What to do if you are LOW RISK for COVID-19?  Reduce your risk of any infection by using the same precautions used for avoiding the common cold or flu:  . Wash your hands often with soap and warm water for at least 20 seconds.  If soap and water are not readily available, use an alcohol-based hand sanitizer with at least 60% alcohol.  . If coughing or sneezing, cover your mouth and nose by coughing or sneezing into the elbow areas of your shirt or coat, into a tissue or into your sleeve (not your hands). . Avoid shaking hands with others and consider head nods or verbal greetings only. . Avoid touching your eyes, nose, or mouth with unwashed hands.  . Avoid close contact with people who are sick. . Avoid places or events with large numbers of people in one location, like concerts or sporting events. . Carefully consider travel plans you have or are making. . If you are planning any travel outside or inside the [place], visit the CDC's Travelers' Health webpage for the latest health notices. . If you have some symptoms but not all symptoms, continue to monitor at home and seek medical attention if your symptoms worsen. . If you are having a medical emergency, call 911.   ADDITIONAL HEALTHCARE OPTIONS FOR PATIENTS  [facility] / e-Visit: [email or web address]         [facility]: [phone]  [place]   Urgent Care: [facility]: [phone]   Prescreened. Neg cm

## 2018-11-14 ENCOUNTER — Other Ambulatory Visit: Payer: Self-pay | Admitting: Obstetrics and Gynecology

## 2018-11-14 ENCOUNTER — Other Ambulatory Visit: Payer: Self-pay

## 2018-11-14 ENCOUNTER — Other Ambulatory Visit: Payer: No Typology Code available for payment source

## 2018-11-14 DIAGNOSIS — Z13220 Encounter for screening for lipoid disorders: Secondary | ICD-10-CM

## 2018-11-14 DIAGNOSIS — R638 Other symptoms and signs concerning food and fluid intake: Secondary | ICD-10-CM

## 2018-11-14 NOTE — Addendum Note (Signed)
Addended by: Lorinda Creed on: 11/14/2018 08:52 AM   Modules accepted: Orders

## 2018-11-15 LAB — CBC WITH DIFFERENTIAL/PLATELET
Basophils Absolute: 0.1 10*3/uL (ref 0.0–0.2)
Basos: 1 %
EOS (ABSOLUTE): 0.2 10*3/uL (ref 0.0–0.4)
Eos: 4 %
Hematocrit: 42.8 % (ref 34.0–46.6)
Hemoglobin: 14.2 g/dL (ref 11.1–15.9)
Immature Grans (Abs): 0 10*3/uL (ref 0.0–0.1)
Immature Granulocytes: 0 %
Lymphocytes Absolute: 1.6 10*3/uL (ref 0.7–3.1)
Lymphs: 31 %
MCH: 29.6 pg (ref 26.6–33.0)
MCHC: 33.2 g/dL (ref 31.5–35.7)
MCV: 89 fL (ref 79–97)
Monocytes Absolute: 0.6 10*3/uL (ref 0.1–0.9)
Monocytes: 11 %
Neutrophils Absolute: 2.7 10*3/uL (ref 1.4–7.0)
Neutrophils: 53 %
Platelets: 248 10*3/uL (ref 150–450)
RBC: 4.79 x10E6/uL (ref 3.77–5.28)
RDW: 12.8 % (ref 11.7–15.4)
WBC: 5.1 10*3/uL (ref 3.4–10.8)

## 2018-11-15 LAB — COMPREHENSIVE METABOLIC PANEL
ALT: 18 IU/L (ref 0–32)
AST: 17 IU/L (ref 0–40)
Albumin/Globulin Ratio: 2 (ref 1.2–2.2)
Albumin: 4.5 g/dL (ref 3.8–4.8)
Alkaline Phosphatase: 56 IU/L (ref 39–117)
BUN/Creatinine Ratio: 12 (ref 9–23)
BUN: 10 mg/dL (ref 6–24)
Bilirubin Total: 0.4 mg/dL (ref 0.0–1.2)
CO2: 23 mmol/L (ref 20–29)
Calcium: 9.5 mg/dL (ref 8.7–10.2)
Chloride: 106 mmol/L (ref 96–106)
Creatinine, Ser: 0.85 mg/dL (ref 0.57–1.00)
GFR calc Af Amer: 94 mL/min/{1.73_m2} (ref >59)
GFR calc non Af Amer: 82 mL/min/{1.73_m2} (ref >59)
Globulin, Total: 2.2 g/dL (ref 1.5–4.5)
Glucose: 84 mg/dL (ref 65–99)
Potassium: 4.7 mmol/L (ref 3.5–5.2)
Sodium: 142 mmol/L (ref 134–144)
Total Protein: 6.7 g/dL (ref 6.0–8.5)

## 2018-11-15 LAB — HGB A1C W/O EAG: Hgb A1c MFr Bld: 5.1 % (ref 4.8–5.6)

## 2018-11-15 LAB — LIPID PANEL W/O CHOL/HDL RATIO
Cholesterol, Total: 189 mg/dL (ref 100–199)
HDL: 55 mg/dL (ref >39)
LDL Calculated: 114 mg/dL — ABNORMAL HIGH (ref 0–99)
Triglycerides: 100 mg/dL (ref 0–149)
VLDL Cholesterol Cal: 20 mg/dL (ref 5–40)

## 2018-11-15 LAB — TSH: TSH: 2.3 u[IU]/mL (ref 0.450–4.500)

## 2018-11-16 ENCOUNTER — Ambulatory Visit (INDEPENDENT_AMBULATORY_CARE_PROVIDER_SITE_OTHER): Payer: No Typology Code available for payment source | Admitting: Obstetrics and Gynecology

## 2018-11-16 ENCOUNTER — Other Ambulatory Visit: Payer: Self-pay

## 2018-11-16 ENCOUNTER — Encounter: Payer: Self-pay | Admitting: Obstetrics and Gynecology

## 2018-11-16 VITALS — BP 130/75 | HR 69 | Ht 62.0 in | Wt 172.9 lb

## 2018-11-16 DIAGNOSIS — E7800 Pure hypercholesterolemia, unspecified: Secondary | ICD-10-CM

## 2018-11-16 DIAGNOSIS — Z01419 Encounter for gynecological examination (general) (routine) without abnormal findings: Secondary | ICD-10-CM | POA: Diagnosis not present

## 2018-11-16 DIAGNOSIS — E669 Obesity, unspecified: Secondary | ICD-10-CM

## 2018-11-16 DIAGNOSIS — E66811 Obesity, class 1: Secondary | ICD-10-CM

## 2018-11-16 DIAGNOSIS — F411 Generalized anxiety disorder: Secondary | ICD-10-CM

## 2018-11-16 DIAGNOSIS — Z1231 Encounter for screening mammogram for malignant neoplasm of breast: Secondary | ICD-10-CM

## 2018-11-16 NOTE — Progress Notes (Signed)
Patient comes in today for yearly physical. She has already had labs done. Mammogram is due in August. She has no concerns today.

## 2018-11-16 NOTE — Patient Instructions (Signed)
Health Maintenance, Female Adopting a healthy lifestyle and getting preventive care are important in promoting health and wellness. Ask your health care provider about:  The right schedule for you to have regular tests and exams.  Things you can do on your own to prevent diseases and keep yourself healthy. What should I know about diet, weight, and exercise? Eat a healthy diet   Eat a diet that includes plenty of vegetables, fruits, low-fat dairy products, and lean protein.  Do not eat a lot of foods that are high in solid fats, added sugars, or sodium. Maintain a healthy weight Body mass index (BMI) is used to identify weight problems. It estimates body fat based on height and weight. Your health care provider can help determine your BMI and help you achieve or maintain a healthy weight. Get regular exercise Get regular exercise. This is one of the most important things you can do for your health. Most adults should:  Exercise for at least 150 minutes each week. The exercise should increase your heart rate and make you sweat (moderate-intensity exercise).  Do strengthening exercises at least twice a week. This is in addition to the moderate-intensity exercise.  Spend less time sitting. Even light physical activity can be beneficial. Watch cholesterol and blood lipids Have your blood tested for lipids and cholesterol at 48 years of age, then have this test every 5 years. Have your cholesterol levels checked more often if:  Your lipid or cholesterol levels are high.  You are older than 48 years of age.  You are at high risk for heart disease. What should I know about cancer screening? Depending on your health history and family history, you may need to have cancer screening at various ages. This may include screening for:  Breast cancer.  Cervical cancer.  Colorectal cancer.  Skin cancer.  Lung cancer. What should I know about heart disease, diabetes, and high blood  pressure? Blood pressure and heart disease  High blood pressure causes heart disease and increases the risk of stroke. This is more likely to develop in people who have high blood pressure readings, are of African descent, or are overweight.  Have your blood pressure checked: ? Every 3-5 years if you are 18-39 years of age. ? Every year if you are 40 years old or older. Diabetes Have regular diabetes screenings. This checks your fasting blood sugar level. Have the screening done:  Once every three years after age 40 if you are at a normal weight and have a low risk for diabetes.  More often and at a younger age if you are overweight or have a high risk for diabetes. What should I know about preventing infection? Hepatitis B If you have a higher risk for hepatitis B, you should be screened for this virus. Talk with your health care provider to find out if you are at risk for hepatitis B infection. Hepatitis C Testing is recommended for:  Everyone born from 1945 through 1965.  Anyone with known risk factors for hepatitis C. Sexually transmitted infections (STIs)  Get screened for STIs, including gonorrhea and chlamydia, if: ? You are sexually active and are younger than 48 years of age. ? You are older than 48 years of age and your health care provider tells you that you are at risk for this type of infection. ? Your sexual activity has changed since you were last screened, and you are at increased risk for chlamydia or gonorrhea. Ask your health care provider if   you are at risk.  Ask your health care provider about whether you are at high risk for HIV. Your health care provider may recommend a prescription medicine to help prevent HIV infection. If you choose to take medicine to prevent HIV, you should first get tested for HIV. You should then be tested every 3 months for as long as you are taking the medicine. Pregnancy  If you are about to stop having your period (premenopausal) and  you may become pregnant, seek counseling before you get pregnant.  Take 400 to 800 micrograms (mcg) of folic acid every day if you become pregnant.  Ask for birth control (contraception) if you want to prevent pregnancy. Osteoporosis and menopause Osteoporosis is a disease in which the bones lose minerals and strength with aging. This can result in bone fractures. If you are 65 years old or older, or if you are at risk for osteoporosis and fractures, ask your health care provider if you should:  Be screened for bone loss.  Take a calcium or vitamin D supplement to lower your risk of fractures.  Be given hormone replacement therapy (HRT) to treat symptoms of menopause. Follow these instructions at home: Lifestyle  Do not use any products that contain nicotine or tobacco, such as cigarettes, e-cigarettes, and chewing tobacco. If you need help quitting, ask your health care provider.  Do not use street drugs.  Do not share needles.  Ask your health care provider for help if you need support or information about quitting drugs. Alcohol use  Do not drink alcohol if: ? Your health care provider tells you not to drink. ? You are pregnant, may be pregnant, or are planning to become pregnant.  If you drink alcohol: ? Limit how much you use to 0-1 drink a day. ? Limit intake if you are breastfeeding.  Be aware of how much alcohol is in your drink. In the [place] one drink equals one 12 oz bottle of beer (355 mL), one 5 oz glass of wine (148 mL), or one 1 oz glass of hard liquor (44 mL). General instructions  Schedule regular health, dental, and eye exams.  Stay current with your vaccines.  Tell your health care provider if: ? You often feel depressed. ? You have ever been abused or do not feel safe at home. Summary  Adopting a healthy lifestyle and getting preventive care are important in promoting health and wellness.  Follow your health care provider's instructions about healthy  diet, exercising, and getting tested or screened for diseases.  Follow your health care provider's instructions on monitoring your cholesterol and blood pressure. This information is not intended to replace advice given to you by your health care provider. Make sure you discuss any questions you have with your health care provider. Document Released: 11/08/2010 Document Revised: 04/18/2018 Document Reviewed: 04/18/2018 Elsevier Patient Education  2020 [employer]  

## 2018-11-18 ENCOUNTER — Encounter: Payer: Self-pay | Admitting: Obstetrics and Gynecology

## 2018-11-18 NOTE — Progress Notes (Signed)
GYNECOLOGY ANNUAL PHYSICAL EXAM PROGRESS NOTE  Subjective:    Lauren Anthony is a 48 y.o. [phone] married female who presents for an annual exam. The patient has no complaints today. The patient is sexually active. The patient wears seatbelts: yes. The patient participates in regular exercise: yes. Has the patient ever been transfused or tattooed?: no. The patient reports that there is not domestic violence in her life.    Gynecologic History  Menarche age: 76 Patient's last menstrual period was 12/14/2014. Contraception: status post hysterectomy History of STI's: Denies Last Pap: 2016. Results were: normal.  Denies h/o abnormal pap smears. Last mammogram: 12/2017. Results were: normal   OB History  Gravida Para Term Preterm AB Living  3 3 1 2  0 3  SAB TAB Ectopic Multiple Live Births  0 0 0 0 3    # Outcome Date GA Lbr Len/2nd Weight Sex Delivery Anes PTL Lv  3 Preterm 11/21/99 [redacted]w[redacted]d  F CS-Unspec   LIV  2 Preterm 01/03/93 329w0d M VBAC  Y LIV  1 Term 08/12/87    F CS-Unspec   LIV    Past Medical History:  Diagnosis Date  . Cholelithiases 2016  . Heavy periods     Past Surgical History:  Procedure Laterality Date  . BILATERAL SALPINGECTOMY Bilateral 12/22/2014   Procedure: BILATERAL SALPINGECTOMY;  Surgeon: AnRubie MaidMD;  Location: [facility] OR[facility];  Service: Gynecology;  Laterality: Bilateral;  . CESAREAN SECTION    . CHOLECYSTECTOMY    . CYSTOSCOPY  12/22/2014   Procedure: CYSTOSCOPY;  Surgeon: AnRubie MaidMD;  Location: [facility] OR[facility];  Service: Gynecology;;  . ENDOMETRIAL ABLATION    . LAPAROSCOPIC GASTRIC SLEEVE RESECTION    . LAPAROSCOPIC HYSTERECTOMY  12/22/2014   Procedure: HYSTERECTOMY TOTAL LAPAROSCOPIC;  Surgeon: AnRubie MaidMD;  Location: [facility];  Service: Gynecology;;  . NORebbeca PaulBLATION  06/2014    Family History  Problem Relation Age of Onset  . Cancer Neg Hx   . Heart disease Neg Hx   . Diabetes Neg Hx   . Breast cancer Neg Hx     Social  History   Socioeconomic History  . Marital status: Married    Spouse name: Not on file  . Number of children: Not on file  . Years of education: Not on file  . Highest education level: Not on file  Occupational History  . Occupation: [employer]. Financial resource strain: Not on file  . Food insecurity    Worry: Not on file    Inability: Not on file  . Transportation needs    Medical: Not on file    Non-medical: Not on file  Tobacco Use  . Smoking status: Never Smoker  . Smokeless tobacco: Never Used  Substance and Sexual Activity  . Alcohol use: Not Currently    Alcohol/week: 0.0 standard drinks    Comment: rare  . Drug use: No  . Sexual activity: Yes    Partners: Male    Birth control/protection: Surgical  Lifestyle  . Physical activity    Days per week: Not on file    Minutes per session: Not on file  . Stress: Not on file  Relationships  . Social coHerbalistn phone: Not on file    Gets together: Not on file    Attends religious service: Not on file    Active member of club or organization: Not  on file    Attends meetings of clubs or organizations: Not on file    Relationship status: Not on file  . Intimate partner violence    Fear of current or ex partner: Not on file    Emotionally abused: Not on file    Physically abused: Not on file    Forced sexual activity: Not on file  Other Topics Concern  . Not on file  Social History Narrative  . Not on file    Current Outpatient Medications on File Prior to Visit  Medication Sig Dispense Refill  . ALPRAZolam (XANAX) 0.5 MG tablet Take 1 tablet (0.5 mg total) by mouth 3 (three) times daily as needed for anxiety. 10 tablet 0  . benzonatate (TESSALON PERLES) 100 MG capsule Take 1-2 capsules (100-200 mg total) by mouth every 8 (eight) hours as needed for cough. 30 capsule 0  . fluticasone (FLONASE) 50 MCG/ACT nasal spray Place 2 sprays into both nostrils 2 (two) times  daily. For 7 days, then once daily. (Patient not taking: Reported on 09/14/2017) 16 g 6   No current facility-administered medications on file prior to visit.     No Known Allergies    Review of Systems Constitutional: negative for chills, fatigue, fevers and sweats Eyes: negative for irritation, redness and visual disturbance Ears, nose, mouth, throat, and face: negative for hearing loss, nasal congestion, snoring and tinnitus Respiratory: negative for asthma, cough, sputum Cardiovascular: negative for chest pain, dyspnea, exertional chest pressure/discomfort, irregular heart beat, palpitations and syncope Gastrointestinal: negative for abdominal pain, change in bowel habits, nausea and vomiting Genitourinary: negative for abnormal menstrual periods, genital lesions, sexual problems and vaginal discharge, dysuria and urinary incontinence Integument/breast: negative for breast lump, breast tenderness and nipple discharge Hematologic/lymphatic: negative for bleeding and easy bruising Musculoskeletal:negative for back pain and muscle weakness Neurological: negative for dizziness, headaches, vertigo and weakness Endocrine: negative for diabetic symptoms including polydipsia, polyuria and skin dryness Allergic/Immunologic: negative for hay fever and urticaria        Objective:  Blood pressure 130/75, pulse 69, height 5' 2"  (1.575 m), weight 172 lb 14.4 oz (78.4 kg), last menstrual period 12/14/2014. Body mass index is 31.62 kg/m.  General Appearance:    Alert, cooperative, no distress, appears stated age, mild obesity  Head:    Normocephalic, without obvious abnormality, atraumatic  Eyes:    PERRL, conjunctiva/corneas clear, EOM's intact, both eyes  Ears:    Normal external ear canals, both ears  Nose:   Nares normal, septum midline, mucosa normal, no drainage or sinus tenderness  Throat:   Lips, mucosa, and tongue normal; teeth and gums normal  Neck:   Supple, symmetrical, trachea  midline, no adenopathy; thyroid: no enlargement/tenderness/nodules; no carotid bruit or JVD  Back:     Symmetric, no curvature, ROM normal, no CVA tenderness  Lungs:     Clear to auscultation bilaterally, respirations unlabored  Chest Wall:    No tenderness or deformity   Heart:    Regular rate and rhythm, S1 and S2 normal, no murmur, rub or gallop  Breast Exam:    No tenderness, masses, or nipple abnormality  Abdomen:     Soft, non-tender, bowel sounds active all four quadrants, no masses, no organomegaly.    Genitalia:    Pelvic:external genitalia normal, vagina without lesions, discharge, or tenderness, rectovaginal septum  normal. Cervix normal in appearance, no cervical motion tenderness, no adnexal masses or tenderness.  Uterus normal size, shape, mobile, regular contours, nontender.  Rectal:  Normal external sphincter.  No hemorrhoids appreciated. Internal exam not done.   Extremities:   Extremities normal, atraumatic, no cyanosis or edema  Pulses:   2+ and symmetric all extremities  Skin:   Skin color, texture, turgor normal, no rashes or lesions  Lymph nodes:   Cervical, supraclavicular, and axillary nodes normal  Neurologic:   CNII-XII intact, normal strength, sensation and reflexes throughout   .  Labs:  Lab Results  Component Value Date   WBC 5.1 11/14/2018   HGB 14.2 11/14/2018   HCT 42.8 11/14/2018   MCV 89 11/14/2018   PLT 248 11/14/2018    Lab Results  Component Value Date   CREATININE 0.85 11/14/2018   BUN 10 11/14/2018   NA 142 11/14/2018   K 4.7 11/14/2018   CL 106 11/14/2018   CO2 23 11/14/2018    Lab Results  Component Value Date   ALT 18 11/14/2018   AST 17 11/14/2018   ALKPHOS 56 11/14/2018   BILITOT 0.4 11/14/2018    Lab Results  Component Value Date   TSH 2.300 11/14/2018    Lab Results  Component Value Date   CHOL 189 11/14/2018   HDL 55 11/14/2018   LDLCALC 114 (H) 11/14/2018   TRIG 100 11/14/2018   CHOLHDL 2.9 09/14/2017      Assessment:    Healthy female exam.   H/o anxiety  Mild obesity Elevated LDL  Plan:     Blood tests: Labs previously performed, reviewed. . Breast self exam technique reviewed and patient encouraged to perform self-exam monthly. Contraception: status post hysterectomy. Discussed healthy lifestyle modifications. Mammogram up to date. Next due in August. Will order. Pap smear up to date.  History of anxiety, currently managed with prn anxiety.  Mildly elevated LDL, advised to continue with low-cholesterol diet and exercise.  Follow up in 1 year for annual exam.    Rubie Maid, MD [facility]

## 2018-11-18 NOTE — Addendum Note (Signed)
Addended by: Augusto Gamble on: 11/18/2018 10:24 PM   Modules accepted: Orders

## 2018-11-30 ENCOUNTER — Telehealth: Payer: Self-pay | Admitting: Obstetrics and Gynecology

## 2018-11-30 NOTE — Telephone Encounter (Signed)
Information was faxed to [facility].

## 2018-11-30 NOTE — Telephone Encounter (Signed)
Saralyn Pilar from [employer] called to get pt's Group ID # for insurance. Please advise.

## 2019-01-29 ENCOUNTER — Ambulatory Visit
Admission: RE | Admit: 2019-01-29 | Discharge: 2019-01-29 | Disposition: A | Discharge status: Home / Self Care | Payer: No Typology Code available for payment source | Source: Ambulatory Visit | Attending: Obstetrics and Gynecology | Admitting: Obstetrics and Gynecology

## 2019-01-29 DIAGNOSIS — Z1231 Encounter for screening mammogram for malignant neoplasm of breast: Secondary | ICD-10-CM | POA: Insufficient documentation

## 2019-03-15 ENCOUNTER — Telehealth: Payer: No Typology Code available for payment source | Admitting: Nurse Practitioner

## 2019-03-15 DIAGNOSIS — J019 Acute sinusitis, unspecified: Secondary | ICD-10-CM

## 2019-03-15 DIAGNOSIS — B9789 Other viral agents as the cause of diseases classified elsewhere: Secondary | ICD-10-CM | POA: Diagnosis not present

## 2019-03-15 MED ORDER — FLUTICASONE PROPIONATE 50 MCG/ACT NA SUSP: 2.0000 | Freq: Every day | NASAL | 0 refills | Status: DC

## 2019-03-15 NOTE — Progress Notes (Signed)
We are sorry that you are not feeling well.  Here is how we plan to help!  Based on what you have shared with me it looks like you have sinusitis.  Sinusitis is inflammation and infection in the sinus cavities of the head.  Based on your presentation I believe you most likely have Acute Viral Sinusitis.This is an infection most likely caused by a virus. There is not specific treatment for viral sinusitis other than to help you with the symptoms until the infection runs its course.  You may use an oral decongestant such as Mucinex D or if you have glaucoma or high blood pressure use plain Mucinex. Saline nasal spray help and can safely be used as often as needed for congestion, I have prescribed: Fluticasone nasal spray two sprays in each nostril once a day. II also recommend over the counter Zyrtec or Ceterizine 22m to help with your symptoms.  In the interim,  I believe a COVID-19 test would be appropriate to ensure this is only a viral sinusitis.   For information on our COVID testing locations and hours go to [email or web address].  Based on your symptoms, I do not believe an antibiotic is necessary at this time.  Antibiotics would be recommended if you develop a fever, or a rapid worsening of symptoms.  Some authorities believe that zinc sprays or the use of Echinacea may shorten the course of your symptoms.  Sinus infections are not as easily transmitted as other respiratory infection, however we still recommend that you avoid close contact with loved ones, especially the very young and elderly.  Remember to wash your hands thoroughly throughout the day as this is the number one way to prevent the spread of infection!  Home Care:  Only take medications as instructed by your medical team.  Do not take these medications with alcohol.  A steam or ultrasonic humidifier can help congestion.  You can place a towel over your head and breathe in the steam from hot water coming  from a faucet.  Avoid close contacts especially the very young and the elderly.  Cover your mouth when you cough or sneeze.  Always remember to wash your hands.  Get Help Right Away If:  You develop worsening fever or sinus pain.  You develop a severe head ache or visual changes.  Your symptoms persist after you have completed your treatment plan.  Make sure you  Understand these instructions.  Will watch your condition.  Will get help right away if you are not doing well or get worse.  Your e-visit answers were reviewed by a board certified advanced clinical practitioner to complete your personal care plan.  Depending on the condition, your plan could have included both over the counter or prescription medications.  If there is a problem please reply  once you have received a response from your provider.  Your safety is important to [place]  If you have drug allergies check your prescription carefully.    You can use MyChart to ask questions about today's visit, request a non-urgent call back, or ask for a work or school excuse for 24 hours related to this e-Visit. If it has been greater than 24 hours you will need to follow up with your provider, or enter a new e-Visit to address those concerns.  You will get an e-mail in the next two days asking about your experience.  I hope that your e-visit has been valuable and will speed your recovery. Thank you  for using e-visits.   I have spent 5 to 10 minutes reviewing and documenting in the patient's chart.

## 2019-05-15 ENCOUNTER — Telehealth: Payer: No Typology Code available for payment source | Admitting: Nurse Practitioner

## 2019-05-15 DIAGNOSIS — J0100 Acute maxillary sinusitis, unspecified: Secondary | ICD-10-CM

## 2019-05-15 MED ORDER — AMOXICILLIN-POT CLAVULANATE 875-125 MG PO TABS: 1.0000 | ORAL_TABLET | Freq: Two times a day (BID) | ORAL | 0 refills | Status: DC

## 2019-05-15 NOTE — Progress Notes (Signed)
We are sorry that you are not feeling well.  Here is how we plan to help!  Based on what you have shared with me it looks like you have sinusitis.  Sinusitis is inflammation and infection in the sinus cavities of the head.  Based on your presentation I believe you most likely have Acute Bacterial Sinusitis.  This is an infection caused by bacteria and is treated with antibiotics. I have prescribed Augmentin 854m/125mg one tablet twice daily with food, for 7 days. You may use an oral decongestant such as Mucinex D or if you have glaucoma or high blood pressure use plain Mucinex. Saline nasal spray help and can safely be used as often as needed for congestion.  If you develop worsening sinus pain, fever or notice severe headache and vision changes, or if symptoms are not better after completion of antibiotic, please schedule an appointment with a health care provider.    Sinus infections are not as easily transmitted as other respiratory infection, however we still recommend that you avoid close contact with loved ones, especially the very young and elderly.  Remember to wash your hands thoroughly throughout the day as this is the number one way to prevent the spread of infection!  Home Care:  Only take medications as instructed by your medical team.  Complete the entire course of an antibiotic.  Do not take these medications with alcohol.  A steam or ultrasonic humidifier can help congestion.  You can place a towel over your head and breathe in the steam from hot water coming from a faucet.  Avoid close contacts especially the very young and the elderly.  Cover your mouth when you cough or sneeze.  Always remember to wash your hands.  Get Help Right Away If:  You develop worsening fever or sinus pain.  You develop a severe head ache or visual changes.  Your symptoms persist after you have completed your treatment plan.  Make sure you  Understand these instructions.  Will watch your  condition.  Will get help right away if you are not doing well or get worse.  Your e-visit answers were reviewed by a board certified advanced clinical practitioner to complete your personal care plan.  Depending on the condition, your plan could have included both over the counter or prescription medications.  If there is a problem please reply  once you have received a response from your provider.  Your safety is important to [place]  If you have drug allergies check your prescription carefully.    You can use MyChart to ask questions about today's visit, request a non-urgent call back, or ask for a work or school excuse for 24 hours related to this e-Visit. If it has been greater than 24 hours you will need to follow up with your provider, or enter a new e-Visit to address those concerns.  You will get an e-mail in the next two days asking about your experience.  I hope that your e-visit has been valuable and will speed your recovery. Thank you for using e-visits.   5-10 minutes spent reviewing and documenting in chart.

## 2019-07-10 ENCOUNTER — Other Ambulatory Visit: Payer: Self-pay

## 2019-07-10 ENCOUNTER — Ambulatory Visit (INDEPENDENT_AMBULATORY_CARE_PROVIDER_SITE_OTHER): Payer: No Typology Code available for payment source | Admitting: Obstetrics and Gynecology

## 2019-07-10 ENCOUNTER — Encounter: Payer: Self-pay | Admitting: Obstetrics and Gynecology

## 2019-07-10 VITALS — BP 126/75 | HR 64 | Ht 62.0 in | Wt 180.4 lb

## 2019-07-10 DIAGNOSIS — K5909 Other constipation: Secondary | ICD-10-CM | POA: Diagnosis not present

## 2019-07-10 DIAGNOSIS — Z7689 Persons encountering health services in other specified circumstances: Secondary | ICD-10-CM | POA: Diagnosis not present

## 2019-07-10 DIAGNOSIS — E669 Obesity, unspecified: Secondary | ICD-10-CM | POA: Diagnosis not present

## 2019-07-10 MED ORDER — PHENTERMINE HCL 30 MG PO CAPS: 30.0000 mg | ORAL_CAPSULE | ORAL | 0 refills | Status: DC

## 2019-07-10 MED ORDER — PHENTERMINE HCL 37.5 MG PO CAPS: 37.5000 mg | ORAL_CAPSULE | ORAL | 0 refills | Status: DC

## 2019-07-10 NOTE — Progress Notes (Signed)
Pt present for weight management. Pt stated that she has noticed increase in weight and decrease in energy since her hysterectomy.

## 2019-07-10 NOTE — Progress Notes (Signed)
    GYNECOLOGY PROGRESS NOTE  Subjective:    Patient ID: Lauren Anthony, female    DOB: December 18, 1970, 49 y.o.   MRN: [id number]  HPI  Patient is a 49 y.o. female who presents for initial weight loss management consultation.  Her past medical history is significant for anxiety. She denies any complaints today.     Current interventions:  1. Diet - reports fasting for breakfast, consumes lunch, dinner, and does have increased in snacking. She does consume coffee. Consumes on average 3-4 bottles of 16 oz of water per day.  2. Activity - working out 3 days weekly, 30 minutes cardio, then weights ~ 30 minutes.  3. Reports bowel movements: can go up to 6-7 days, has been ongoing for at least 1 year. Previously had regular daily bowel movements. Also has a prior history of gall bladder surgery   The following portions of the patient's history were reviewed and updated as appropriate: allergies, current medications, past family history, past medical history, past social history, past surgical history and problem list.  Review of Systems Pertinent items noted in HPI and remainder of comprehensive ROS otherwise negative.   Objective:    Vitals with BMI 07/10/2019 11/16/2018 09/14/2017  Height 5' 2"  5' 2"  5' 2"   Weight 180 lbs 6 oz 172 lbs 14.4 oz 178 lbs 3 oz  BMI 32.99 18.29  93.71  Systolic 696 789 381  Diastolic 75 75 64  Pulse 64 69 60    General appearance: alert and no distress  CVS: normal rate and rhythm. No murmurs, rubs, gallops.  Lungs: Normal respiratory effort, chest expands symmetrically. Lungs are clear to auscultation, no crackles or wheezes. Abdomen: soft, non-tender. Abdominoplasty scar present.  Waist circumference 39.5 in.  Neuro: grossly normal.    Labs:   Assessment:   Weight management Obesity, Body mass index is 33 kg/m.  Plan:   1. Discussed diet - patient can engage in intermittent fasting (as she is already fasting for breakfast, consider 18:6), or can modify  diet to more low carb/high protein diet. Also discussed options for weight loss medications.  Discussed risks/benefits of use. Goal is to lose 10% of current weight at the end of 3 months, with weight goal weight target 162 lbs.  Ultimate weight to be in normal BMI category is 135 lbs. Patient ok to try Phentermine for initial weight loss management, with use for 3 months.  After this time, would like to consider changing to Topamax (off-label use) for continued weight loss management as she feels that most of her issues with weight are associated with appetite suppression and cravings. Will start with Phentermine 30 mg dosing x 1 week to assess for side effects before increasing to 37.5 mg dosing due to patient's h/o anxiety (as side effects of medication can mimic anxiety with elevated heart rate, jitteriness).  2. Can increase exercise to 4 times daily.  3. Discussed use of miralax or stool softeners for improving bowel function, can also use metamucil daily.  4. Will assess Vitamin B12 and Vitamin D levels 5. RTC in 1 month for f/u weight check.   A total of 15 minutes were spent face-to-face with the patient during this encounter and over half of that time dealt with counseling and coordination of care.   Rubie Maid, MD [facility]

## 2019-07-11 LAB — VITAMIN D 25 HYDROXY (VIT D DEFICIENCY, FRACTURES): Vit D, 25-Hydroxy: 28.5 ng/mL — ABNORMAL LOW (ref 30.0–100.0)

## 2019-07-11 LAB — VITAMIN B12: Vitamin B-12: 742 pg/mL (ref 232–1245)

## 2019-08-07 ENCOUNTER — Encounter: Payer: No Typology Code available for payment source | Admitting: Obstetrics and Gynecology

## 2019-08-07 NOTE — Progress Notes (Deleted)
Pt present for weight management. Pt's last visit 07/10/2019 was 180 lb.

## 2019-08-14 ENCOUNTER — Encounter: Payer: Self-pay | Admitting: Obstetrics and Gynecology

## 2019-08-14 ENCOUNTER — Ambulatory Visit (INDEPENDENT_AMBULATORY_CARE_PROVIDER_SITE_OTHER): Payer: No Typology Code available for payment source | Admitting: Obstetrics and Gynecology

## 2019-08-14 ENCOUNTER — Other Ambulatory Visit: Payer: Self-pay

## 2019-08-14 VITALS — BP 113/72 | HR 73 | Ht 62.0 in | Wt 171.5 lb

## 2019-08-14 DIAGNOSIS — Z713 Dietary counseling and surveillance: Secondary | ICD-10-CM | POA: Diagnosis not present

## 2019-08-14 DIAGNOSIS — E669 Obesity, unspecified: Secondary | ICD-10-CM

## 2019-08-14 DIAGNOSIS — Z7689 Persons encountering health services in other specified circumstances: Secondary | ICD-10-CM

## 2019-08-14 DIAGNOSIS — T887XXA Unspecified adverse effect of drug or medicament, initial encounter: Secondary | ICD-10-CM

## 2019-08-14 DIAGNOSIS — E559 Vitamin D deficiency, unspecified: Secondary | ICD-10-CM | POA: Diagnosis not present

## 2019-08-14 MED ORDER — PHENTERMINE HCL 30 MG PO CAPS: 30.0000 mg | ORAL_CAPSULE | ORAL | 0 refills | Status: DC

## 2019-08-14 NOTE — Progress Notes (Signed)
    GYNECOLOGY PROGRESS NOTE  Subjective:    Patient ID: Lauren Anthony, female    DOB: Jan 21, 1971, 49 y.o.   MRN: [id number]  HPI  Patient is a 49 y.o. female who presents for 1 month weight management follow up. She initiated use of Phentermine 1 month ago.  Reports complaints of intermittent chest tightness, heart racing, and headaches.  Also notes that it sometimes causes sleep disturbances (difficulty falling asleep at night). Reports compliance with medications.    Current interventions:  1. Diet - reports overall doing well with diet, limiting carbs, increased protein (but states she sometimes has her moments with sweets). Endorses adequate water intake. Is also continuing intermittent fasting. 2. Activity - exercising at least 5 days per week, works out at [employer], also does [employer].  3. Reports bowel movements have improved since last visit.     The following portions of the patient's history were reviewed and updated as appropriate: allergies, current medications, past family history, past medical history, past social history, past surgical history and problem list.  Review of Systems Pertinent items noted in HPI and remainder of comprehensive ROS otherwise negative.   Objective:    Vitals with BMI 08/14/2019 07/10/2019 09/14/2017  Height 5' 2"  5' 2"  5' 2"   Weight 171 lbs 8 oz 180 lbs 6 oz 178 lbs 3 oz  BMI 31.36 27.06 23.76  Systolic 283 151 761  Diastolic 72 75 64  Pulse 73 64 60  Some encounter information is confidential and restricted. Go to Review Flowsheets activity to see all data.    General appearance: alert and no distress Abdomen: soft, non-tender.  Waist circumference 38 in.    Labs:  Office Visit on 07/10/2019  Component Date Value Ref Range Status  . Vit D, 25-Hydroxy 07/10/2019 28.5* 30.0 - 100.0 ng/mL Final   Comment: Vitamin D deficiency has been defined by the [place] practice guideline as a level of serum 25-OH  vitamin D less than 20 ng/mL (1,2). The Endocrine Society went on to further define vitamin D insufficiency as a level between 21 and 29 ng/mL (2). 1. IOM (Institute of Medicine). 2010. Dietary reference    intakes for calcium and D. [place]: The    [employer]. 2. Holick MF, Binkley Onaway, Bischoff-Ferrari HA, et al.    Evaluation, treatment, and prevention of vitamin D    deficiency: an Endocrine Society clinical practice    guideline. JCEM. 2011 Jul; 96(7):1911-30.   . Vitamin B-12 07/10/2019 742  232 - 1,245 pg/mL Final    Assessment:   Weight management Obesity, Body mass index is 31.37 kg/m.  Medication side effect Vitamin D insufficiency  Plan:   1. Weight management - Patient currently on Phentermine 37.5 mg. Due to side effects of medication, will decrease back down to 30 mg.  Has had a total weight loss of 9 lbs this month. Reiterated goal of loss of at least 10% of current weight at the end of 3 months on medications (18 lbs).  Her current weight goal weight target 162 lbs.  Ultimate weight to be in normal BMI category is 135 lbs.  2. Vitamin D insufficiency - can take OTC Vitamin D supplementation for mild insufficiency.   Follow up in 1 month for weight check (nurse visit).    Rubie Maid, MD [facility]

## 2019-08-14 NOTE — Patient Instructions (Signed)
Preventing Unhealthy Weight Gain, Adult Staying at a healthy weight is important to your overall health. When fat builds up in your body, you may become overweight or obese. Being overweight or obese increases your risk of developing certain health problems, such as heart disease, diabetes, sleeping problems, joint problems, and some types of cancer. Unhealthy weight gain is often the result of making unhealthy food choices or not getting enough exercise. You can make changes to your lifestyle to prevent obesity and stay as healthy as possible. What nutrition changes can be made?   Eat only as much as your body needs. To do this: ? Pay attention to signs that you are hungry or full. Stop eating as soon as you feel full. ? If you feel hungry, try drinking water first before eating. Drink enough water so your urine is clear or pale yellow. ? Eat smaller portions. Pay attention to portion sizes when eating out. ? Look at serving sizes on food labels. Most foods contain more than one serving per container. ? Eat the recommended number of calories for your gender and activity level. For most active people, a daily total of 2,000 calories is appropriate. If you are trying to lose weight or are not very active, you may need to eat fewer calories. Talk with your health care provider or a diet and nutrition specialist (dietitian) about how many calories you need each day.  Choose healthy foods, such as: ? Fruits and vegetables. At each meal, try to fill at least half of your plate with fruits and vegetables. ? Whole grains, such as whole-wheat bread, brown rice, and quinoa. ? Lean meats, such as chicken or fish. ? Other healthy proteins, such as beans, eggs, or tofu. ? Healthy fats, such as nuts, seeds, fatty fish, and olive oil. ? Low-fat or fat-free dairy products.  Check food labels, and avoid food and drinks that: ? Are high in calories. ? Have added sugar. ? Are high in sodium. ? Have saturated  fats or trans fats.  Cook foods in healthier ways, such as by baking, broiling, or grilling.  Make a meal plan for the week, and shop with a grocery list to help you stay on track with your purchases. Try to avoid going to the grocery store when you are hungry.  When grocery shopping, try to shop around the outside of the store first, where the fresh foods are. Doing this helps you to avoid prepackaged foods, which can be high in sugar, salt (sodium), and fat. What lifestyle changes can be made?   Exercise for 30 or more minutes on 5 or more days each week. Exercising may include brisk walking, yard work, biking, running, swimming, and team sports like basketball and soccer. Ask your health care provider which exercises are safe for you.  Do muscle-strengthening activities, such as lifting weights or using resistance bands, on 2 or more days a week.  Do not use any products that contain nicotine or tobacco, such as cigarettes and e-cigarettes. If you need help quitting, ask your health care provider.  Limit alcohol intake to no more than 1 drink a day for nonpregnant women and 2 drinks a day for men. One drink equals 12 oz of beer, 5 oz of wine, or 1 oz of hard liquor.  Try to get 7-9 hours of sleep each night. What other changes can be made?  Keep a food and activity journal to keep track of: ? What you ate and how many calories  you had. Remember to count the calories in sauces, dressings, and side dishes. ? Whether you were active, and what exercises you did. ? Your calorie, weight, and activity goals.  Check your weight regularly. Track any changes. If you notice you have gained weight, make changes to your diet or activity routine.  Avoid taking weight-loss medicines or supplements. Talk to your health care provider before starting any new medicine or supplement.  Talk to your health care provider before trying any new diet or exercise plan. Why are these changes  important? Eating healthy, staying active, and having healthy habits can help you to prevent obesity. Those changes also:  Help you manage stress and emotions.  Help you connect with friends and family.  Improve your self-esteem.  Improve your sleep.  Prevent long-term health problems. What can happen if changes are not made? Being obese or overweight can cause you to develop joint or bone problems, which can make it hard for you to stay active or do activities you enjoy. Being obese or overweight also puts stress on your heart and lungs and can lead to health problems like diabetes, heart disease, and some cancers. Where to find more information Talk with your health care provider or a dietitian about healthy eating and healthy lifestyle choices. You may also find information from:  [place]. Department of Agriculture, MyPlate: FormerBoss.no  American Heart Association: www.heart.org  Centers for Disease Control and Prevention: http://www.wolf.info/ Summary  Staying at a healthy weight is important to your overall health. It helps you to prevent certain diseases and health problems, such as heart disease, diabetes, joint problems, sleep disorders, and some types of cancer.  Being obese or overweight can cause you to develop joint or bone problems, which can make it hard for you to stay active or do activities you enjoy.  You can prevent unhealthy weight gain by eating a healthy diet, exercising regularly, not smoking, limiting alcohol, and getting enough sleep.  Talk with your health care provider or a dietitian for guidance about healthy eating and healthy lifestyle choices. This information is not intended to replace advice given to you by your health care provider. Make sure you discuss any questions you have with your health care provider. Document Revised: 04/28/2017 Document Reviewed: 06/01/2016 Elsevier Patient Education  2020 [employer].

## 2019-08-14 NOTE — Progress Notes (Signed)
Pt present for weight management. Pt stated that the current dose of phenetamine seems to be too much and would like to discuss decreasing the amount. Pt's last visit 07/10/2019 wt 180lbs.

## 2019-09-13 ENCOUNTER — Ambulatory Visit (INDEPENDENT_AMBULATORY_CARE_PROVIDER_SITE_OTHER): Payer: No Typology Code available for payment source | Admitting: Obstetrics and Gynecology

## 2019-09-13 ENCOUNTER — Other Ambulatory Visit: Payer: Self-pay

## 2019-09-13 VITALS — BP 120/77 | HR 72 | Ht 62.0 in | Wt 170.2 lb

## 2019-09-13 DIAGNOSIS — Z7689 Persons encountering health services in other specified circumstances: Secondary | ICD-10-CM

## 2019-09-13 DIAGNOSIS — E669 Obesity, unspecified: Secondary | ICD-10-CM

## 2019-09-13 NOTE — Progress Notes (Signed)
Pt present for weight management. Pt's last visit 08/14/2019 wt 171 lb bp 113/72 p 73. Waist measurement 37 inches. Pt stated that she is not sure if the medication is working, but do not want to go back to the phentermine 37.49m due to the side effect. Pt was asking if she could add on the medication that was suggested during her last visit.

## 2019-09-18 MED ORDER — PHENTERMINE HCL 30 MG PO CAPS: 30.0000 mg | ORAL_CAPSULE | ORAL | 0 refills | Status: DC

## 2019-09-25 MED ORDER — TOPIRAMATE 25 MG PO TABS: 25.0000 mg | ORAL_TABLET | Freq: Every evening | ORAL | 0 refills | Status: DC

## 2019-09-25 NOTE — Addendum Note (Signed)
Addended by: Augusto Gamble on: 09/25/2019 05:53 PM   Modules accepted: Orders

## 2019-10-14 ENCOUNTER — Telehealth: Payer: Self-pay | Admitting: Obstetrics and Gynecology

## 2019-10-14 NOTE — Telephone Encounter (Signed)
Pt called in and is on vacation, she needs to move her appt. I put the pt on the 15th in a same day spot I wanted to make sure that was okay. I told her I would put her there and reach out to the nurse. Please advise

## 2019-10-15 ENCOUNTER — Encounter: Payer: No Typology Code available for payment source | Admitting: Obstetrics and Gynecology

## 2019-10-20 ENCOUNTER — Other Ambulatory Visit: Payer: Self-pay | Admitting: Obstetrics and Gynecology

## 2019-10-20 DIAGNOSIS — E66811 Obesity, class 1: Secondary | ICD-10-CM | POA: Insufficient documentation

## 2019-10-20 DIAGNOSIS — E669 Obesity, unspecified: Secondary | ICD-10-CM | POA: Insufficient documentation

## 2019-10-20 MED ORDER — PHENTERMINE HCL 30 MG PO CAPS: 30.0000 mg | ORAL_CAPSULE | ORAL | 0 refills | Status: DC

## 2019-10-20 NOTE — Progress Notes (Signed)
I have sent in the refill.   Dr. Marcelline Mates

## 2019-10-21 NOTE — Progress Notes (Signed)
Pt present for weight management. Pt's last visit 09/13/2019 170lb.

## 2019-10-22 ENCOUNTER — Other Ambulatory Visit: Payer: Self-pay

## 2019-10-22 ENCOUNTER — Ambulatory Visit (INDEPENDENT_AMBULATORY_CARE_PROVIDER_SITE_OTHER): Payer: No Typology Code available for payment source | Admitting: Obstetrics and Gynecology

## 2019-10-22 ENCOUNTER — Encounter: Payer: Self-pay | Admitting: Obstetrics and Gynecology

## 2019-10-22 VITALS — BP 123/81 | HR 76 | Ht 62.0 in | Wt 167.7 lb

## 2019-10-22 DIAGNOSIS — Z7689 Persons encountering health services in other specified circumstances: Secondary | ICD-10-CM | POA: Diagnosis not present

## 2019-10-22 DIAGNOSIS — E669 Obesity, unspecified: Secondary | ICD-10-CM | POA: Diagnosis not present

## 2019-10-22 NOTE — Progress Notes (Signed)
    GYNECOLOGY PROGRESS NOTE  Subjective:    Patient ID: Lauren Anthony, female    DOB: [birth year], 49 y.o.   MRN: [id number]  HPI  Patient is a 49 y.o. female who presents for 1 month weight management follow up. She initiated use of Phentermine 3 months ago (March 2021).  Denies any undesirable side effects after going back to lower dose of Phentermine (30 mg down from 37.5 mg) and reports compliance with medications. Did not initiate Topamax as recommended last visit as she was concerned after reading possible side effects.    Current interventions:  1. Diet - Reports overall was doing well with diet, however did go on vacation last week and did not adhere to her diet very well.  2. Activity - exercising in the gym 3-4 times per week, for at least 30-45 minutes.  3. Reports bowel movements are normal.    The following portions of the patient's history were reviewed and updated as appropriate: allergies, current medications, past family history, past medical history, past social history, past surgical history and problem list.  Review of Systems Pertinent items are noted in HPI. Pertinent items noted in HPI and remainder of comprehensive ROS otherwise negative.   Objective:    Vitals with BMI 10/22/2019 09/25/2019 08/14/2019  Height 5' 2"  5' 2"  5' 2"   Weight 167 lbs 11 oz 170 lbs 3 oz 171 lbs 8 oz  BMI 30.66 24.58 09.98  Systolic 338 250 539  Diastolic 81 77 72  Pulse 76 72 73  Some encounter information is confidential and restricted. Go to Review Flowsheets activity to see all data.    General appearance: alert and no distress Abdomen: soft, non-tender.  Waist circumference 36 in.    Labs:  No new labs Assessment:   Weight management Obesity, Body mass index is 30.67 kg/m.  Plan:   1. Patient doing well, still losing weight with Phentermine use (despite decreased compliance with diet this past week). Still lost 3 lbs since last month.  Discussion had again on use of  Phentermine in conjunction with Topamax to further amplify weight loss.  Discussed risks/benefits of medication. Patient notes she is now more comfortable with the medication, and will begin the Topamax. Now that she is no longer on vacation, has resumed normal weight loss diet and physical activity.  2. Follow up in 1 month for weight management.    Rubie Maid, MD [facility]

## 2019-11-22 ENCOUNTER — Encounter: Payer: No Typology Code available for payment source | Admitting: Obstetrics and Gynecology

## 2019-11-29 ENCOUNTER — Other Ambulatory Visit: Payer: Self-pay

## 2019-12-02 ENCOUNTER — Other Ambulatory Visit: Payer: Self-pay

## 2019-12-03 MED ORDER — PHENTERMINE HCL 30 MG PO CAPS: 30.0000 mg | ORAL_CAPSULE | ORAL | 0 refills | Status: DC

## 2019-12-06 ENCOUNTER — Other Ambulatory Visit: Payer: Self-pay

## 2019-12-06 ENCOUNTER — Encounter: Payer: Self-pay | Admitting: Obstetrics and Gynecology

## 2019-12-06 ENCOUNTER — Ambulatory Visit (INDEPENDENT_AMBULATORY_CARE_PROVIDER_SITE_OTHER): Payer: No Typology Code available for payment source | Admitting: Obstetrics and Gynecology

## 2019-12-06 VITALS — BP 121/85 | HR 80 | Ht 62.0 in | Wt 165.6 lb

## 2019-12-06 DIAGNOSIS — Z7689 Persons encountering health services in other specified circumstances: Secondary | ICD-10-CM

## 2019-12-06 DIAGNOSIS — Z1231 Encounter for screening mammogram for malignant neoplasm of breast: Secondary | ICD-10-CM | POA: Diagnosis not present

## 2019-12-06 DIAGNOSIS — E669 Obesity, unspecified: Secondary | ICD-10-CM

## 2019-12-06 DIAGNOSIS — Z01419 Encounter for gynecological examination (general) (routine) without abnormal findings: Secondary | ICD-10-CM

## 2019-12-06 NOTE — Progress Notes (Signed)
GYNECOLOGY ANNUAL PHYSICAL EXAM PROGRESS NOTE  Subjective:    Lauren Anthony is a 49 y.o. [phone] female who presents for an annual exam. The patient has no complaints today. The patient is sexually active.  The patient wears seatbelts: yes. The patient participates in regular exercise: yes. Has the patient ever been transfused or tattooed?: no.    She reports that with regards to her weight loss management, she is not taking Topamax as recommended because she forgets at night.     Gynecologic History  Menarche age: 14 Patient's last menstrual period was 12/14/2014.  She is s/p hysterectomy.  Contraception: status post hysterectomy History of STI's: Denies Last Pap: 2016. Results were: normal.  Denies h/o abnormal pap smears. Is s/p hysterectomy.  Last mammogram: 01/29/2019. Results were: normal   OB History  Gravida Para Term Preterm AB Living  3 3 1 2  0 3  SAB TAB Ectopic Multiple Live Births  0 0 0 0 3    # Outcome Date GA Lbr Len/2nd Weight Sex Delivery Anes PTL Lv  3 Preterm 11/21/99 [redacted]w[redacted]d  F CS-Unspec   LIV  2 Preterm 01/03/93 312w0d M VBAC  Y LIV  1 Term 08/12/87    F CS-Unspec   LIV    Past Medical History:  Diagnosis Date  . Cholelithiases 2016  . Heavy periods     Past Surgical History:  Procedure Laterality Date  . BILATERAL SALPINGECTOMY Bilateral 12/22/2014   Procedure: BILATERAL SALPINGECTOMY;  Surgeon: AnRubie MaidMD;  Location: [facility] OR[facility];  Service: Gynecology;  Laterality: Bilateral;  . CESAREAN SECTION    . CHOLECYSTECTOMY    . CYSTOSCOPY  12/22/2014   Procedure: CYSTOSCOPY;  Surgeon: AnRubie MaidMD;  Location: [facility] OR[facility];  Service: Gynecology;;  . ENDOMETRIAL ABLATION    . LAPAROSCOPIC GASTRIC SLEEVE RESECTION    . LAPAROSCOPIC HYSTERECTOMY  12/22/2014   Procedure: HYSTERECTOMY TOTAL LAPAROSCOPIC;  Surgeon: AnRubie MaidMD;  Location: [facility] OR[facility];  Service: Gynecology;;  . NORebbeca PaulBLATION  06/2014    Family History  Problem Relation Age of  Onset  . Lung cancer Mother   . Cancer Neg Hx   . Heart disease Neg Hx   . Diabetes Neg Hx   . Breast cancer Neg Hx     Social History   Socioeconomic History  . Marital status: Married    Spouse name: Not on file  . Number of children: Not on file  . Years of education: Not on file  . Highest education level: Not on file  Occupational History  . Occupation: [employer]  . Smoking status: Never Smoker  . Smokeless tobacco: Never Used  Vaping Use  . Vaping Use: Never used  Substance and Sexual Activity  . Alcohol use: Not Currently    Alcohol/week: 0.0 standard drinks    Comment: rare  . Drug use: No  . Sexual activity: Yes    Partners: Male    Birth control/protection: Surgical  Other Topics Concern  . Not on file  Social History Narrative  . Not on file   Social Determinants of Health   Financial Resource Strain:   . Difficulty of Paying Living Expenses:   Food Insecurity:   . Worried About RuCharity fundraisern the Last Year:   . RaArboriculturistn the Last Year:   Transportation Needs:   . LaFilm/video editorMedical):   . Marland Kitchenack  of Transportation (Non-Medical):   Physical Activity:   . Days of Exercise per Week:   . Minutes of Exercise per Session:   Stress:   . Feeling of Stress :   Social Connections:   . Frequency of Communication with Friends and Family:   . Frequency of Social Gatherings with Friends and Family:   . Attends Religious Services:   . Active Member of Clubs or Organizations:   . Attends Archivist Meetings:   Marland Kitchen Marital Status:   Intimate Partner Violence:   . Fear of Current or Ex-Partner:   . Emotionally Abused:   Marland Kitchen Physically Abused:   . Sexually Abused:     Current Outpatient Medications on File Prior to Visit  Medication Sig Dispense Refill  . phentermine 30 MG capsule Take 1 capsule (30 mg total) by mouth every morning. 30 capsule 0  . topiramate (TOPAMAX) 25 MG tablet Take 1  tablet (25 mg total) by mouth at bedtime. (Patient not taking: Reported on 10/22/2019) 30 tablet 0   No current facility-administered medications on file prior to visit.    No Known Allergies   Review of Systems Constitutional: negative for chills, fatigue, fevers and sweats Eyes: negative for irritation, redness and visual disturbance Ears, nose, mouth, throat, and face: negative for hearing loss, nasal congestion, snoring and tinnitus Respiratory: negative for asthma, cough, sputum Cardiovascular: negative for chest pain, dyspnea, exertional chest pressure/discomfort, irregular heart beat, palpitations and syncope Gastrointestinal: negative for abdominal pain, change in bowel habits, nausea and vomiting Genitourinary: negative for abnormal menstrual periods, genital lesions, sexual problems and vaginal discharge, dysuria and urinary incontinence Integument/breast: negative for breast lump, breast tenderness and nipple discharge Hematologic/lymphatic: negative for bleeding and easy bruising Musculoskeletal:negative for back pain and muscle weakness Neurological: negative for dizziness, headaches, vertigo and weakness Endocrine: negative for diabetic symptoms including polydipsia, polyuria and skin dryness Allergic/Immunologic: negative for hay fever and urticaria        Objective:   Vitals with BMI 12/06/2019 10/22/2019 09/25/2019  Height 5' 2"  5' 2"  5' 2"   Weight 165 lbs 10 oz 167 lbs 11 oz 170 lbs 3 oz  BMI 30.28 16.10 96.04  Systolic 540 981 191  Diastolic 85 81 77  Pulse 80 76 72  Some encounter information is confidential and restricted. Go to Review Flowsheets activity to see all data.     General Appearance:    Alert, cooperative, no distress, appears stated age, mildly obese  Head:    Normocephalic, without obvious abnormality, atraumatic  Eyes:    PERRL, conjunctiva/corneas clear, EOM's intact, both eyes  Ears:    Normal external ear canals, both ears  Nose:   Nares  normal, septum midline, mucosa normal, no drainage or sinus tenderness  Throat:   Lips, mucosa, and tongue normal; teeth and gums normal  Neck:   Supple, symmetrical, trachea midline, no adenopathy; thyroid: no enlargement/tenderness/nodules; no carotid bruit or JVD  Back:     Symmetric, no curvature, ROM normal, no CVA tenderness  Lungs:     Clear to auscultation bilaterally, respirations unlabored  Chest Wall:    No tenderness or deformity   Heart:    Regular rate and rhythm, S1 and S2 normal, no murmur, rub or gallop  Breast Exam:    No tenderness, masses, or nipple abnormality  Abdomen:     Soft, non-tender, bowel sounds active all four quadrants, no masses, no organomegaly.    Genitalia:    Pelvic:external genitalia normal, vagina without  lesions, discharge, or tenderness, rectovaginal septum  normal. Uterus and cervix surgically absent.   No adnexal masses or tenderness.     Rectal:    Normal external sphincter.  No hemorrhoids appreciated. Internal exam not done.   Extremities:   Extremities normal, atraumatic, no cyanosis or edema  Pulses:   2+ and symmetric all extremities  Skin:   Skin color, texture, turgor normal, no rashes or lesions  Lymph nodes:   Cervical, supraclavicular, and axillary nodes normal  Neurologic:   CNII-XII intact, normal strength, sensation and reflexes throughout   .  Labs:  Lab Results  Component Value Date   WBC 5.1 11/14/2018   HGB 14.2 11/14/2018   HCT 42.8 11/14/2018   MCV 89 11/14/2018   PLT 248 11/14/2018    Lab Results  Component Value Date   CREATININE 0.85 11/14/2018   BUN 10 11/14/2018   NA 142 11/14/2018   K 4.7 11/14/2018   CL 106 11/14/2018   CO2 23 11/14/2018    Lab Results  Component Value Date   ALT 18 11/14/2018   AST 17 11/14/2018   ALKPHOS 56 11/14/2018   BILITOT 0.4 11/14/2018    Lab Results  Component Value Date   TSH 2.300 11/14/2018     Assessment:   Routine gynecologic exam. Obesity, mild Weight loss  management   Plan:    Blood tests: Orders placed. Patient will return fasting.  Breast self exam technique reviewed and patient encouraged to perform self-exam monthly. Discussed healthy lifestyle modifications. Advised patient that she can take the Topamax during the morning or mid-day, this will help accelerate her weight loss as she has only lost 2 lbs since last visit.  If minimal weight loss noted next visit, may need to consider hiatus from weight loss medication.  Mammogram up to date. Next due in September. Ordered.  Pap smears no longer required, has had a hysterectomy and no prior h/o cervical dysplasia.  Follow up in 1 year for annual exam   Rubie Maid, MD [facility]

## 2019-12-06 NOTE — Progress Notes (Signed)
Pt present for annual exam. Pt stated that she was doing well no problems.

## 2019-12-06 NOTE — Patient Instructions (Signed)
Health Maintenance, Female Adopting a healthy lifestyle and getting preventive care are important in promoting health and wellness. Ask your health care provider about:  The right schedule for you to have regular tests and exams.  Things you can do on your own to prevent diseases and keep yourself healthy. What should I know about diet, weight, and exercise? Eat a healthy diet   Eat a diet that includes plenty of vegetables, fruits, low-fat dairy products, and lean protein.  Do not eat a lot of foods that are high in solid fats, added sugars, or sodium. Maintain a healthy weight Body mass index (BMI) is used to identify weight problems. It estimates body fat based on height and weight. Your health care provider can help determine your BMI and help you achieve or maintain a healthy weight. Get regular exercise Get regular exercise. This is one of the most important things you can do for your health. Most adults should:  Exercise for at least 150 minutes each week. The exercise should increase your heart rate and make you sweat (moderate-intensity exercise).  Do strengthening exercises at least twice a week. This is in addition to the moderate-intensity exercise.  Spend less time sitting. Even light physical activity can be beneficial. Watch cholesterol and blood lipids Have your blood tested for lipids and cholesterol at 49 years of age, then have this test every 5 years. Have your cholesterol levels checked more often if:  Your lipid or cholesterol levels are high.  You are older than 49 years of age.  You are at high risk for heart disease. What should I know about cancer screening? Depending on your health history and family history, you may need to have cancer screening at various ages. This may include screening for:  Breast cancer.  Cervical cancer.  Colorectal cancer.  Skin cancer.  Lung cancer. What should I know about heart disease, diabetes, and high blood  pressure? Blood pressure and heart disease  High blood pressure causes heart disease and increases the risk of stroke. This is more likely to develop in people who have high blood pressure readings, are of African descent, or are overweight.  Have your blood pressure checked: ? Every 3-5 years if you are 18-39 years of age. ? Every year if you are 40 years old or older. Diabetes Have regular diabetes screenings. This checks your fasting blood sugar level. Have the screening done:  Once every three years after age 40 if you are at a normal weight and have a low risk for diabetes.  More often and at a younger age if you are overweight or have a high risk for diabetes. What should I know about preventing infection? Hepatitis B If you have a higher risk for hepatitis B, you should be screened for this virus. Talk with your health care provider to find out if you are at risk for hepatitis B infection. Hepatitis C Testing is recommended for:  Everyone born from 1945 through 1965.  Anyone with known risk factors for hepatitis C. Sexually transmitted infections (STIs)  Get screened for STIs, including gonorrhea and chlamydia, if: ? You are sexually active and are younger than 49 years of age. ? You are older than 49 years of age and your health care provider tells you that you are at risk for this type of infection. ? Your sexual activity has changed since you were last screened, and you are at increased risk for chlamydia or gonorrhea. Ask your health care provider if   you are at risk.  Ask your health care provider about whether you are at high risk for HIV. Your health care provider may recommend a prescription medicine to help prevent HIV infection. If you choose to take medicine to prevent HIV, you should first get tested for HIV. You should then be tested every 3 months for as long as you are taking the medicine. Pregnancy  If you are about to stop having your period (premenopausal) and  you may become pregnant, seek counseling before you get pregnant.  Take 400 to 800 micrograms (mcg) of folic acid every day if you become pregnant.  Ask for birth control (contraception) if you want to prevent pregnancy. Osteoporosis and menopause Osteoporosis is a disease in which the bones lose minerals and strength with aging. This can result in bone fractures. If you are 27 years old or older, or if you are at risk for osteoporosis and fractures, ask your health care provider if you should:  Be screened for bone loss.  Take a calcium or vitamin D supplement to lower your risk of fractures.  Be given hormone replacement therapy (HRT) to treat symptoms of menopause. Follow these instructions at home: Lifestyle  Do not use any products that contain nicotine or tobacco, such as cigarettes, e-cigarettes, and chewing tobacco. If you need help quitting, ask your health care provider.  Do not use street drugs.  Do not share needles.  Ask your health care provider for help if you need support or information about quitting drugs. Alcohol use  Do not drink alcohol if: ? Your health care provider tells you not to drink. ? You are pregnant, may be pregnant, or are planning to become pregnant.  If you drink alcohol: ? Limit how much you use to 0-1 drink a day. ? Limit intake if you are breastfeeding.  Be aware of how much alcohol is in your drink. In the [place] one drink equals one 12 oz bottle of beer (355 mL), one 5 oz glass of wine (148 mL), or one 1 oz glass of hard liquor (44 mL). General instructions  Schedule regular health, dental, and eye exams.  Stay current with your vaccines.  Tell your health care provider if: ? You often feel depressed. ? You have ever been abused or do not feel safe at home. Summary  Adopting a healthy lifestyle and getting preventive care are important in promoting health and wellness.  Follow your health care provider's instructions about healthy  diet, exercising, and getting tested or screened for diseases.  Follow your health care provider's instructions on monitoring your cholesterol and blood pressure. This information is not intended to replace advice given to you by your health care provider. Make sure you discuss any questions you have with your health care provider. Document Revised: 04/18/2018 Document Reviewed: 04/18/2018 Elsevier Patient Education  2020 [place] Breast self-awareness means being familiar with how your breasts look and feel. It involves checking your breasts regularly and reporting any changes to your health care provider. Practicing breast self-awareness is important. Sometimes changes may not be harmful (are benign), but sometimes a change in your breasts can be a sign of a serious medical problem. It is important to learn how to do this procedure correctly so that you can catch problems early, when treatment is more likely to be successful. All women should practice breast self-awareness, including women who have had breast implants. What you need:  A mirror.  A well-lit  room. How to do a breast self-exam A breast self-exam is one way to learn what is normal for your breasts and whether your breasts are changing. To do a breast self-exam: Look for changes  1. Remove all the clothing above your waist. 2. Stand in front of a mirror in a room with good lighting. 3. Put your hands on your hips. 4. Push your hands firmly downward. 5. Compare your breasts in the mirror. Look for differences between them (asymmetry), such as: ? Differences in shape. ? Differences in size. ? Puckers, dips, and bumps in one breast and not the other. 6. Look at each breast for changes in the skin, such as: ? Redness. ? Scaly areas. 7. Look for changes in your nipples, such as: ? Discharge. ? Bleeding. ? Dimpling. ? Redness. ? A change in position. Feel for changes Carefully feel your  breasts for lumps and changes. It is best to do this while lying on your back on the floor, and again while sitting or standing in the tub or shower with soapy water on your skin. Feel each breast in the following way: 1. Place the arm on the side of the breast you are examining above your head. 2. Feel your breast with the other hand. 3. Start in the nipple area and make -inch (2 cm) overlapping circles to feel your breast. Use the pads of your three middle fingers to do this. Apply light pressure, then medium pressure, then firm pressure. The light pressure will allow you to feel the tissue closest to the skin. The medium pressure will allow you to feel the tissue that is a little deeper. The firm pressure will allow you to feel the tissue close to the ribs. 4. Continue the overlapping circles, moving downward over the breast until you feel your ribs below your breast. 5. Move one finger-width toward the center of the body. Continue to use the -inch (2 cm) overlapping circles to feel your breast as you move slowly up toward your collarbone. 6. Continue the up-and-down exam using all three pressures until you reach your armpit.  Write down what you find Writing down what you find can help you remember what to discuss with your health care provider. Write down:  What is normal for each breast.  Any changes that you find in each breast, including: ? The kind of changes you find. ? Any pain or tenderness. ? Size and location of any lumps.  Where you are in your menstrual cycle, if you are still menstruating. General tips and recommendations  Examine your breasts every month.  If you are breastfeeding, the best time to examine your breasts is after a feeding or after using a breast pump.  If you menstruate, the best time to examine your breasts is 5-7 days after your period. Breasts are generally lumpier during menstrual periods, and it may be more difficult to notice changes.  With time  and practice, you will become more familiar with the variations in your breasts and more comfortable with the exam. Contact a health care provider if you:  See a change in the shape or size of your breasts or nipples.  See a change in the skin of your breast or nipples, such as a reddened or scaly area.  Have unusual discharge from your nipples.  Find a lump or thick area that was not there before.  Have pain in your breasts.  Have any concerns related to your breast health. Summary  Breast  self-awareness includes looking for physical changes in your breasts, as well as feeling for any changes within your breasts.  Breast self-awareness should be performed in front of a mirror in a well-lit room.  You should examine your breasts every month. If you menstruate, the best time to examine your breasts is 5-7 days after your menstrual period.  Let your health care provider know of any changes you notice in your breasts, including changes in size, changes on the skin, pain or tenderness, or unusual fluid from your nipples. This information is not intended to replace advice given to you by your health care provider. Make sure you discuss any questions you have with your health care provider. Document Revised: 12/12/2017 Document Reviewed: 12/12/2017 Elsevier Patient Education  [place].

## 2019-12-16 ENCOUNTER — Other Ambulatory Visit: Payer: Self-pay | Admitting: Obstetrics and Gynecology

## 2019-12-16 DIAGNOSIS — J0110 Acute frontal sinusitis, unspecified: Secondary | ICD-10-CM

## 2019-12-16 MED ORDER — AZITHROMYCIN 250 MG PO TABS
ORAL_TABLET | ORAL | 1 refills | Status: DC
Start: 2019-12-16 — End: 2020-03-26

## 2019-12-16 NOTE — Progress Notes (Signed)
Patient noting symptoms of sinus infection, has been using Flonase without relief. Will send in Z-pac.  If no further relief, would recommend further evaluation (including COVID testing).

## 2020-01-07 ENCOUNTER — Encounter: Payer: No Typology Code available for payment source | Admitting: Obstetrics and Gynecology

## 2020-01-07 ENCOUNTER — Other Ambulatory Visit: Payer: No Typology Code available for payment source

## 2020-01-30 ENCOUNTER — Ambulatory Visit
Admission: RE | Admit: 2020-01-30 | Discharge: 2020-01-30 | Disposition: A | Discharge status: Home / Self Care | Payer: No Typology Code available for payment source | Source: Ambulatory Visit | Attending: Obstetrics and Gynecology | Admitting: Obstetrics and Gynecology

## 2020-01-30 ENCOUNTER — Other Ambulatory Visit: Payer: Self-pay

## 2020-01-30 DIAGNOSIS — Z1231 Encounter for screening mammogram for malignant neoplasm of breast: Secondary | ICD-10-CM | POA: Insufficient documentation

## 2020-02-28 ENCOUNTER — Telehealth: Payer: No Typology Code available for payment source | Admitting: Emergency Medicine

## 2020-02-28 ENCOUNTER — Other Ambulatory Visit: Payer: Self-pay | Admitting: Emergency Medicine

## 2020-02-28 DIAGNOSIS — H9209 Otalgia, unspecified ear: Secondary | ICD-10-CM

## 2020-02-28 MED ORDER — AMOXICILLIN-POT CLAVULANATE 875-125 MG PO TABS
1.0000 | ORAL_TABLET | Freq: Two times a day (BID) | ORAL | 0 refills | Status: DC
Start: 2020-02-28 — End: 2020-03-26

## 2020-02-28 NOTE — Progress Notes (Signed)
E Visit for Swimmer's Ear **Please do not respond to this message unless you have follow up questions.**  We are sorry that you are not feeling well. Here is how we plan to help!    Augmentin 642m one tablet by mouth twice a day for 10 days  In certain cases swimmer's ear may progress to a more serious bacterial infection of the middle or inner ear.  If you have a fever 102 and up and significantly worsening symptoms, this could indicate a more serious infection moving to the middle/inner and needs face to face evaluation in an office by a provider.  Your symptoms should improve over the next 3 days and should resolve in about 7 days.  HOME CARE:   Wash your hands frequently.  Do not place the tip of the bottle on your ear or touch it with your fingers.  You can take Acetominophen 650 mg every 4-6 hours as needed for pain.  If pain is severe or moderate, you can apply a heating pad (set on low) or hot water bottle (wrapped in a towel) to outer ear for 20 minutes.  This will also increase drainage.  Avoid ear plugs  Do not use Q-tips  After showers, help the water run out by tilting your head to one side.  GET HELP RIGHT AWAY IF:   Fever is over 102.2 degrees.  You develop progressive ear pain or hearing loss.  Ear symptoms persist longer than 3 days after treatment.  MAKE SURE YOU:   Understand these instructions.  Will watch your condition.  Will get help right away if you are not doing well or get worse.  TO PREVENT SWIMMER'S EAR:  Use a bathing cap or custom fitted swim molds to keep your ears dry.  Towel off after swimming to dry your ears.  Tilt your head or pull your earlobes to allow the water to escape your ear canal.  If there is still water in your ears, consider using a hairdryer on the lowest setting.  Thank you for choosing an e-visit. Your e-visit answers were reviewed by a board certified advanced clinical practitioner to complete your personal  care plan. Depending upon the condition, your plan could have included both over the counter or prescription medications. Please review your pharmacy choice. Be sure that the pharmacy you have chosen is open so that you can pick up your prescription now.  If there is a problem you may message your provider in [place] have the prescription routed to another pharmacy. Your safety is important to [place] If you have drug allergies check your prescription carefully.  For the next 24 hours, you can use MyChart to ask questions about today's visit, request a non-urgent call back, or ask for a work or school excuse from your e-visit provider. You will get an email in the next two days asking about your experience. I hope that your e-visit has been valuable and will speed your recovery.     Greater than 5 but less than 10 minutes spent researching, coordinating, and implementing care for this patient today

## 2020-03-26 ENCOUNTER — Other Ambulatory Visit: Payer: Self-pay | Admitting: Emergency Medicine

## 2020-03-26 ENCOUNTER — Telehealth: Payer: No Typology Code available for payment source | Admitting: Emergency Medicine

## 2020-03-26 DIAGNOSIS — R300 Dysuria: Secondary | ICD-10-CM | POA: Diagnosis not present

## 2020-03-26 MED ORDER — CEPHALEXIN 500 MG PO CAPS
500.0000 mg | ORAL_CAPSULE | Freq: Two times a day (BID) | ORAL | 0 refills | Status: DC
Start: 2020-03-26 — End: 2020-04-09

## 2020-03-26 NOTE — Progress Notes (Signed)
We are sorry that you are not feeling well.  Here is how we plan to help!  Based on what you shared with me it looks like you most likely have a simple urinary tract infection.  A UTI (Urinary Tract Infection) is a bacterial infection of the bladder.  Most cases of urinary tract infections are simple to treat but a key part of your care is to encourage you to drink plenty of fluids and watch your symptoms carefully. I have prescribed Keflex 500 mg twice a day for 7 days.  Your symptoms should gradually improve. Call us if the burning in your urine worsens, you develop worsening fever, back pain or pelvic pain or if your symptoms do not resolve after completing the antibiotic.  Urinary tract infections can be prevented by drinking plenty of water to keep your body hydrated.  Also be sure when you wipe, wipe from front to back and don't hold it in!  If possible, empty your bladder every 4 hours.  Your e-visit answers were reviewed by a board certified advanced clinical practitioner to complete your personal care plan.  Depending on the condition, your plan could have included both over the counter or prescription medications.  If there is a problem please reply  once you have received a response from your provider.  Your safety is important to [place].  If you have drug allergies check your prescription carefully.    You can use MyChart to ask questions about todays visit, request a non-urgent call back, or ask for a work or school excuse for 24 hours related to this e-Visit. If it has been greater than 24 hours you will need to follow up with your provider, or enter a new e-Visit to address those concerns.   You will get an e-mail in the next two days asking about your experience.  I hope that your e-visit has been valuable and will speed your recovery. Thank you for using e-visits.   Approximately 5 minutes was used in reviewing the patient's chart, questionnaire, prescribing medications, and  documentation.

## 2020-04-09 ENCOUNTER — Other Ambulatory Visit: Payer: Self-pay | Admitting: Family

## 2020-04-09 ENCOUNTER — Telehealth: Payer: No Typology Code available for payment source | Admitting: Family

## 2020-04-09 DIAGNOSIS — J0100 Acute maxillary sinusitis, unspecified: Secondary | ICD-10-CM | POA: Diagnosis not present

## 2020-04-09 MED ORDER — DOXYCYCLINE HYCLATE 100 MG PO TABS
100.0000 mg | ORAL_TABLET | Freq: Two times a day (BID) | ORAL | 0 refills | Status: DC
Start: 2020-04-09 — End: 2020-07-29

## 2020-04-09 NOTE — Progress Notes (Signed)
We are sorry that you are not feeling well.  Here is how we plan to help!  Based on what you have shared with me it looks like you have sinusitis.  Sinusitis is inflammation and infection in the sinus cavities of the head.  Based on your presentation I believe you most likely have Acute Bacterial Sinusitis.  This is an infection caused by bacteria and is treated with antibiotics. I have prescribed Doxycycline 125m by mouth twice a day for 10 days. You may use an oral decongestant such as Mucinex D or if you have glaucoma or high blood pressure use plain Mucinex. Saline nasal spray help and can safely be used as often as needed for congestion.  If you develop worsening sinus pain, fever or notice severe headache and vision changes, or if symptoms are not better after completion of antibiotic, please schedule an appointment with a health care provider.    Sinus infections are not as easily transmitted as other respiratory infection, however we still recommend that you avoid close contact with loved ones, especially the very young and elderly.  Remember to wash your hands thoroughly throughout the day as this is the number one way to prevent the spread of infection!  Home Care:  Only take medications as instructed by your medical team.  Complete the entire course of an antibiotic.  Do not take these medications with alcohol.  A steam or ultrasonic humidifier can help congestion.  You can place a towel over your head and breathe in the steam from hot water coming from a faucet.  Avoid close contacts especially the very young and the elderly.  Cover your mouth when you cough or sneeze.  Always remember to wash your hands.  Get Help Right Away If:  You develop worsening fever or sinus pain.  You develop a severe head ache or visual changes.  Your symptoms persist after you have completed your treatment plan.  Make sure you  Understand these instructions.  Will watch your  condition.  Will get help right away if you are not doing well or get worse.  Your e-visit answers were reviewed by a board certified advanced clinical practitioner to complete your personal care plan.  Depending on the condition, your plan could have included both over the counter or prescription medications.  If there is a problem please reply  once you have received a response from your provider.  Your safety is important to [place]  If you have drug allergies check your prescription carefully.    You can use MyChart to ask questions about today's visit, request a non-urgent call back, or ask for a work or school excuse for 24 hours related to this e-Visit. If it has been greater than 24 hours you will need to follow up with your provider, or enter a new e-Visit to address those concerns.  You will get an e-mail in the next two days asking about your experience.  I hope that your e-visit has been valuable and will speed your recovery. Thank you for using e-visits.  Greater than 5 minutes, yet less than 10 minutes of time have been spent researching, coordinating, and implementing care for this patient.

## 2020-05-08 ENCOUNTER — Telehealth: Payer: No Typology Code available for payment source | Admitting: Physician Assistant

## 2020-05-08 DIAGNOSIS — J0191 Acute recurrent sinusitis, unspecified: Secondary | ICD-10-CM

## 2020-05-08 MED ORDER — LEVOFLOXACIN 500 MG PO TABS: 500.0000 mg | ORAL_TABLET | Freq: Every day | ORAL | 0 refills | Status: DC

## 2020-05-08 NOTE — Progress Notes (Signed)
We are sorry that you are not feeling well.  Here is how we plan to help!  I see that you did take Doxycycline.  Doxy is a strong antibiotic and well suited for sinus infections.  I am concerned that this did not help you.  I will write a different antibiotic, but you will need a face to face exam early next week for further evaluation of your recurrent sinusitis.   Based on what you have shared with me it looks like you have sinusitis.  Sinusitis is inflammation and infection in the sinus cavities of the head.  Based on your presentation I believe you most likely have Acute Bacterial Sinusitis.  This is an infection caused by bacteria and is treated with antibiotics. I have prescribed Levofloxicin 574m by mouth once daily for 7 days. You may use an oral decongestant such as Mucinex D or if you have glaucoma or high blood pressure use plain Mucinex. Saline nasal spray help and can safely be used as often as needed for congestion.  If you develop worsening sinus pain, fever or notice severe headache and vision changes, or if symptoms are not better after completion of antibiotic, please schedule an appointment with a health care provider.    Sinus infections are not as easily transmitted as other respiratory infection, however we still recommend that you avoid close contact with loved ones, especially the very young and elderly.  Remember to wash your hands thoroughly throughout the day as this is the number one way to prevent the spread of infection!  Home Care:  Only take medications as instructed by your medical team.  Complete the entire course of an antibiotic.  Do not take these medications with alcohol.  A steam or ultrasonic humidifier can help congestion.  You can place a towel over your head and breathe in the steam from hot water coming from a faucet.  Avoid close contacts especially the very young and the elderly.  Cover your mouth when you cough or sneeze.  Always remember to wash  your hands.  Get Help Right Away If:  You develop worsening fever or sinus pain.  You develop a severe head ache or visual changes.  Your symptoms persist after you have completed your treatment plan.  Make sure you  Understand these instructions.  Will watch your condition.  Will get help right away if you are not doing well or get worse.  Your e-visit answers were reviewed by a board certified advanced clinical practitioner to complete your personal care plan.  Depending on the condition, your plan could have included both over the counter or prescription medications.  If there is a problem please reply  once you have received a response from your provider.  Your safety is important to [place]  If you have drug allergies check your prescription carefully.    You can use MyChart to ask questions about today's visit, request a non-urgent call back, or ask for a work or school excuse for 24 hours related to this e-Visit. If it has been greater than 24 hours you will need to follow up with your provider, or enter a new e-Visit to address those concerns.  You will get an e-mail in the next two days asking about your experience.  I hope that your e-visit has been valuable and will speed your recovery. Thank you for using e-visits.   Greater than 5 minutes, yet less than 10 minutes of time have been spent researching, coordinating, and  implementing care for this patient today

## 2020-07-29 ENCOUNTER — Telehealth: Payer: No Typology Code available for payment source | Admitting: Physician Assistant

## 2020-07-29 ENCOUNTER — Other Ambulatory Visit: Payer: Self-pay | Admitting: Physician Assistant

## 2020-07-29 DIAGNOSIS — J019 Acute sinusitis, unspecified: Secondary | ICD-10-CM

## 2020-07-29 DIAGNOSIS — B9789 Other viral agents as the cause of diseases classified elsewhere: Secondary | ICD-10-CM | POA: Diagnosis not present

## 2020-07-29 MED ORDER — IPRATROPIUM BROMIDE 0.03 % NA SOLN: 2.0000 | Freq: Two times a day (BID) | NASAL | 0 refills | Status: DC

## 2020-07-29 NOTE — Progress Notes (Signed)
I have spent 5 minutes in review of e-visit questionnaire, review and updating patient chart, medical decision making and response to patient.   Juston Goheen Cody Jadah Bobak, PA-C    

## 2020-07-29 NOTE — Progress Notes (Signed)
We are sorry that you are not feeling well.  Here is how we plan to help!  Based on what you have shared with me it looks like you have sinusitis.  Sinusitis is inflammation and infection in the sinus cavities of the head.  Based on your presentation I believe you most likely have Acute Viral Sinusitis.This is an infection most likely caused by a virus. There is not specific treatment for viral sinusitis other than to help you with the symptoms until the infection runs its course.  You may use an oral decongestant such as Mucinex D or if you have glaucoma or high blood pressure use plain Mucinex. Saline nasal spray help and can safely be used as often as needed for congestion, I have prescribed: Ipratropium Bromide nasal spray 0.03% 2 sprays in eah nostril 2-3 times a day  Some authorities believe that zinc sprays or the use of Echinacea may shorten the course of your symptoms.  Sinus infections are not as easily transmitted as other respiratory infection, however we still recommend that you avoid close contact with loved ones, especially the very young and elderly.  Remember to wash your hands thoroughly throughout the day as this is the number one way to prevent the spread of infection!  Home Care:  Only take medications as instructed by your medical team.  Do not take these medications with alcohol.  A steam or ultrasonic humidifier can help congestion.  You can place a towel over your head and breathe in the steam from hot water coming from a faucet.  Avoid close contacts especially the very young and the elderly.  Cover your mouth when you cough or sneeze.  Always remember to wash your hands.  Get Help Right Away If:  You develop worsening fever or sinus pain.  You develop a severe head ache or visual changes.  Your symptoms persist after you have completed your treatment plan.  Make sure you  Understand these instructions.  Will watch your condition.  Will get help right  away if you are not doing well or get worse.  Your e-visit answers were reviewed by a board certified advanced clinical practitioner to complete your personal care plan.  Depending on the condition, your plan could have included both over the counter or prescription medications.  If there is a problem please reply  once you have received a response from your provider.  Your safety is important to [place].  If you have drug allergies check your prescription carefully.    You can use MyChart to ask questions about today's visit, request a non-urgent call back, or ask for a work or school excuse for 24 hours related to this e-Visit. If it has been greater than 24 hours you will need to follow up with your provider, or enter a new e-Visit to address those concerns.  You will get an e-mail in the next two days asking about your experience.  I hope that your e-visit has been valuable and will speed your recovery. Thank you for using e-visits.

## 2020-07-29 NOTE — Progress Notes (Signed)
Duplicate e-visit encounter. Disregard.

## 2020-09-10 ENCOUNTER — Other Ambulatory Visit: Payer: Self-pay

## 2020-09-10 ENCOUNTER — Telehealth: Payer: No Typology Code available for payment source | Admitting: Physician Assistant

## 2020-09-10 DIAGNOSIS — R300 Dysuria: Secondary | ICD-10-CM

## 2020-09-10 MED ORDER — CEPHALEXIN 500 MG PO CAPS
500.0000 mg | ORAL_CAPSULE | Freq: Two times a day (BID) | ORAL | 0 refills | Status: AC
  Filled 2020-09-10: qty 14, 7d supply, fill #0

## 2020-09-10 NOTE — Progress Notes (Signed)
We are sorry that you are not feeling well.  Here is how we plan to help!  Based on what you shared with me it looks like you most likely have a simple urinary tract infection.  A UTI (Urinary Tract Infection) is a bacterial infection of the bladder.  Most cases of urinary tract infections are simple to treat but a key part of your care is to encourage you to drink plenty of fluids and watch your symptoms carefully.  I have prescribed Keflex 500 mg twice a day for 7 days.  Your symptoms should gradually improve. Call us if the burning in your urine worsens, you develop worsening fever, back pain or pelvic pain or if your symptoms do not resolve after completing the antibiotic.  Urinary tract infections can be prevented by drinking plenty of water to keep your body hydrated.  Also be sure when you wipe, wipe from front to back and don't hold it in!  If possible, empty your bladder every 4 hours.  Your e-visit answers were reviewed by a board certified advanced clinical practitioner to complete your personal care plan.  Depending on the condition, your plan could have included both over the counter or prescription medications.  If there is a problem please reply  once you have received a response from your provider.  Your safety is important to [place].  If you have drug allergies check your prescription carefully.    You can use MyChart to ask questions about today's visit, request a non-urgent call back, or ask for a work or school excuse for 24 hours related to this e-Visit. If it has been greater than 24 hours you will need to follow up with your provider, or enter a new e-Visit to address those concerns.   You will get an e-mail in the next two days asking about your experience.  I hope that your e-visit has been valuable and will speed your recovery. Thank you for using e-visits.

## 2020-09-10 NOTE — Progress Notes (Signed)
I have spent 5 minutes in review of e-visit questionnaire, review and updating patient chart, medical decision making and response to patient.   Sarahi Borland Cody Ciel Chervenak, PA-C    

## 2020-09-14 ENCOUNTER — Other Ambulatory Visit: Payer: Self-pay | Admitting: Obstetrics and Gynecology

## 2020-09-14 ENCOUNTER — Telehealth: Payer: Self-pay | Admitting: Obstetrics and Gynecology

## 2020-09-14 DIAGNOSIS — N951 Menopausal and female climacteric states: Secondary | ICD-10-CM

## 2020-09-14 DIAGNOSIS — N644 Mastodynia: Secondary | ICD-10-CM

## 2020-09-14 DIAGNOSIS — Z90710 Acquired absence of both cervix and uterus: Secondary | ICD-10-CM

## 2020-09-14 NOTE — Telephone Encounter (Signed)
Patient called, expressed concern about left breast pain for several weeks.  Also had excruciating pain in her lower abdomne last week with distention and bloating.  She reports having a telehealth urgent care visit and they gave her Kefelx for a suspected bladder infection.  Does report abdominal pain and bloating improved after having a BM.    Advised that her breast pain could be hormonal in nature, or could be dietary. Advised to limit caffeine, utilize ice packs and NSAIDs.  Patient also desires labs to assess her hormonal status (has had a hysterectomy in the past so unsure if she is already in menopause or not). Will order labs.  If no improvement in symptoms, would recommend that she come in for evaluation and could consider diagnostic imaging at that time.    Rubie Maid, MD [facility]

## 2020-09-15 ENCOUNTER — Other Ambulatory Visit: Payer: No Typology Code available for payment source

## 2020-09-15 ENCOUNTER — Other Ambulatory Visit: Payer: Self-pay

## 2020-09-15 DIAGNOSIS — N951 Menopausal and female climacteric states: Secondary | ICD-10-CM

## 2020-09-15 DIAGNOSIS — N644 Mastodynia: Secondary | ICD-10-CM

## 2020-09-15 DIAGNOSIS — Z90710 Acquired absence of both cervix and uterus: Secondary | ICD-10-CM

## 2020-09-16 LAB — ESTRADIOL: Estradiol: 215 pg/mL

## 2020-09-16 LAB — PROGESTERONE: Progesterone: 7.4 ng/mL

## 2020-09-16 LAB — FSH/LH
FSH: 5.1 m[IU]/mL
LH: 10 m[IU]/mL

## 2020-11-10 ENCOUNTER — Other Ambulatory Visit: Payer: Self-pay

## 2020-11-10 ENCOUNTER — Ambulatory Visit (INDEPENDENT_AMBULATORY_CARE_PROVIDER_SITE_OTHER): Payer: No Typology Code available for payment source | Admitting: Obstetrics and Gynecology

## 2020-11-10 ENCOUNTER — Encounter: Payer: Self-pay | Admitting: Obstetrics and Gynecology

## 2020-11-10 VITALS — BP 119/73 | HR 67 | Ht 62.0 in | Wt 176.2 lb

## 2020-11-10 DIAGNOSIS — E669 Obesity, unspecified: Secondary | ICD-10-CM

## 2020-11-10 MED ORDER — SEMAGLUTIDE-WEIGHT MANAGEMENT 0.25 MG/0.5ML ~~LOC~~ SOAJ
0.2500 mg | SUBCUTANEOUS | 0 refills | Status: DC
  Filled 2020-11-10: qty 2, 28d supply, fill #0

## 2020-11-10 MED ORDER — SEMAGLUTIDE-WEIGHT MANAGEMENT 1.7 MG/0.75ML ~~LOC~~ SOAJ
1.7000 mg | SUBCUTANEOUS | 0 refills | Status: DC
  Filled 2020-11-10: qty 3, 28d supply, fill #0

## 2020-11-10 MED ORDER — SEMAGLUTIDE-WEIGHT MANAGEMENT 0.5 MG/0.5ML ~~LOC~~ SOAJ
0.5000 mg | SUBCUTANEOUS | 0 refills | Status: DC
  Filled 2020-11-10: qty 2, 28d supply, fill #0

## 2020-11-10 MED ORDER — SEMAGLUTIDE-WEIGHT MANAGEMENT 1 MG/0.5ML ~~LOC~~ SOAJ
1.0000 mg | SUBCUTANEOUS | 0 refills | Status: DC
  Filled 2020-11-10: qty 2, 28d supply, fill #0

## 2020-11-10 MED ORDER — SEMAGLUTIDE-WEIGHT MANAGEMENT 2.4 MG/0.75ML ~~LOC~~ SOAJ
2.4000 mg | SUBCUTANEOUS | 6 refills | Status: DC
  Filled 2020-11-10: qty 3, fill #0

## 2020-11-10 MED ORDER — TOPIRAMATE 50 MG PO TABS
50.0000 mg | ORAL_TABLET | Freq: Two times a day (BID) | ORAL | 3 refills | Status: DC
Start: 2020-11-10 — End: 2020-12-09
  Filled 2020-11-10: qty 60, 30d supply, fill #0

## 2020-11-10 NOTE — Patient Instructions (Signed)
Topiramate extended-release capsules What is this medication? TOPIRAMATE (toe PYRE a mate) treats seizures in people with epilepsy. It is also used to prevent migraines. It works by calming overactive nerves in yourbody. This medicine may be used for other purposes; ask your health care provider orpharmacist if you have questions. COMMON BRAND NAME(S): Trokendi XR What should I tell my care team before I take this medication? They need to know if you have any of these conditions: Bleeding disorder If you often drink alcohol Kidney disease Lung disease Suicidal thoughts, plans or attempt An unusual or allergic reaction to topiramate, other medications, foods, dyes, or preservatives Pregnant or trying to get pregnant Breast-feeding How should I use this medication? Take this medication by mouth with water. Take it as directed on the prescription label at the same time every day. Do not cut, crush or chew this medication. Swallow the capsules whole. You can take it with or without food. If it upsets your stomach, take it with food. Keep taking it unless your careteam tells you to stop. A special MedGuide will be given to you by the pharmacist with eachprescription and refill. Be sure to read this information carefully each time. Talk to your care team about the use of this medication in children. While it may be prescribed for children as young as 6 years for selected conditions,precautions do apply. Overdosage: If you think you have taken too much of this medicine contact apoison control center or emergency room at once. NOTE: This medicine is only for you. Do not share this medicine with others. What if I miss a dose? If you miss a dose, take it as soon as you can. If it is almost time for yournext dose, take only that dose. Do not take double or extra doses. What may interact with this medication? Acetazolamide Alcohol Antihistamines for allergy, cough, and cold Aspirin and aspirin-like  medications Atropine Birth control pills Certain medications for anxiety or sleep Certain medications for bladder problems like oxybutynin, tolterodine Certain medications for depression like amitriptyline, fluoxetine, sertraline Certain medications for Parkinson's disease like benztropine, trihexyphenidyl Certain medications for seizures like carbamazepine, lamotrigine, phenobarbital, phenytoin, primidone, valproic acid, zonisamide Certain medications for stomach problems like dicyclomine, hyoscyamine Certain medications for travel sickness like scopolamine Certain medications that treat or prevent blood clots like warfarin, enoxaparin, dalteparin, apixaban, dabigatran, and rivaroxaban Digoxin Diltiazem General anesthetics like halothane, isoflurane, methoxyflurane, propofol Glyburide Hydrochlorothiazide Ipratropium Lithium Medications that relax muscles Metformin Narcotic medications for pain NSAIDs, medications for pain and inflammation, like ibuprofen or naproxen Phenothiazines like chlorpromazine, mesoridazine, prochlorperazine, thioridazine Pioglitazone This list may not describe all possible interactions. Give your health care provider a list of all the medicines, herbs, non-prescription drugs, or dietary supplements you use. Also tell them if you smoke, drink alcohol, or use illegaldrugs. Some items may interact with your medicine. What should I watch for while using this medication? Visit your care team for regular checks on your progress. Tell your care teamif your symptoms do not start to get better or if they get worse. Do not suddenly stop taking this medication. You may develop a severe reaction. Your care team will tell you how much medication to take. If your care team wants you to stop the medication, the dose may be slowly lowered over time toavoid any side effects. Wear a medical ID bracelet or chain. Carry a card that describes yourcondition. List the medications and  doses you take on the card. You may get drowsy or dizzy.  Do not drive, use machinery, or do anything that needs mental alertness until you know how this medication affects you. Do not stand up or sit up quickly, especially if you are an older patient. This reduces the risk of dizzy or fainting spells. Alcohol may interfere with theeffects of this medication. Avoid alcoholic drinks. This medication may cause serious skin reactions. They can happen weeks to months after starting the medication. Contact your care team right away if you notice fevers or flu-like symptoms with a rash. The rash may be red or purple and then turn into blisters or peeling of the skin. Or, you might notice a red rash with swelling of the face, lips or lymph nodes in your neck or under yourarms. Watch for new or worsening thoughts of suicide or depression. This includes sudden changes in mood, behaviors, or thoughts. These changes can happen at any time but are more common in the beginning of treatment or after a change in dose. Call your care team right away if you experience these thoughts orworsening depression. This medication may slow your child's growth if it is taken for a long time athigh doses. Your care team will monitor your child's growth. Using this medication for a long time may weaken your bones. The risk of bonefractures may be increased. Talk to your care team about your bone health. Do not become pregnant while taking this medication. Hormone forms of birth control may not work as well with this medication. Talk to your care team about other forms of birth control. There is potential for serious harm to an unbornchild. Tell your care team right away if you think you might be pregnant. What side effects may I notice from receiving this medication? Side effects that you should report to your care team as soon as possible: Allergic reactions-skin rash, itching, hives, swelling of the face, lips, tongue, or throat High  acid levels-trouble breathing, fast irregular heartbeat, headache, confusion, unusually weak or tired, nausea, vomiting High ammonia levels-unusual weakness or fatigue, confusion, loss of appetite, nausea, vomiting, seizures High fever, fever that does not go away, or decreased sweating Kidney stones-blood in the urine, pain or trouble passing urine, pain in the lower back or sides Redness, blistering, peeling or loosening of the skin, including inside the mouth Sudden eye pain or change in vision such as blurry vision, seeing halos around lights, vision loss Thoughts of suicide or self-harm, worsening mood, feelings of depression Side effects that usually do not require medical attention (report to your careteam if they continue or are bothersome): Anxiety, nervousness Change in taste Diarrhea Dizziness Drowsiness Fatigue Loss of appetite with weight loss Pain, tingling, or numbness in the hands or feet Trouble concentrating Trouble speaking This list may not describe all possible side effects. Call your doctor for medical advice about side effects. You may report side effects to FDA at1-800-FDA-1088. Where should I keep my medication? Keep out of the reach of children and pets. Store at room temperature between 20 and 25 degrees C (68 and 77 degrees F). Protect from light and moisture. Keep the container tightly closed. Get rid ofany unused medication after the expiration date. To get rid of medications that are no longer needed or have expired: Take the medication to a medication take-back program. Check with your pharmacy or law enforcement to find a location. If you cannot return the medication, check the label or package insert to see if the medication should be thrown out in the garbage or flushed  down the toilet. If you are not sure, ask your care team. If it is safe to put it in the trash, empty the medication out of the container. Mix the medication with cat litter, dirt, coffee  grounds, or other unwanted substance. Seal the mixture in a bag or container. Put it in the trash. NOTE: This sheet is a summary. It may not cover all possible information. If you have questions about this medicine, talk to your doctor, pharmacist, orhealth care provider.  2022 [employer]vier/Gold Standard (2020-06-08 10:01:08)

## 2020-11-10 NOTE — Progress Notes (Signed)
Pt present for weight management. Pt stated that she was doing well.

## 2020-11-10 NOTE — Progress Notes (Signed)
    GYNECOLOGY PROGRESS NOTE  Subjective:    Patient ID: Lauren Anthony, female    DOB: 1970-07-30, 50 y.o.   MRN: [id number]  HPI  Patient is a 50 y.o. female who presents for discussion of weight management for her obesity.  She has a history of use of Phentermine for weight loss last year, however had to discontinue after several months due to side effects (notes significant mood changes and irritability).  She notes that despite starting back with changing diet (although she typically eats healthy regularly), and exercising regularly for the past several months, she is not losing any weight.  Has gained 10 lbs since last year's visit. Goal is to lose 20 lbs.  Desires to discuss other options for weight management. Has heard about several other drug options.    Current interventions:  1. Diet - currently purchasing prepped high protein/low carb meals.  2. Activity - exercising 4-5 days per week (goes to the gym).   The following portions of the patient's history were reviewed and updated as appropriate: allergies, current medications, past family history, past medical history, past social history, past surgical history, and problem list.  Review of Systems Pertinent items noted in HPI and remainder of comprehensive ROS otherwise negative.   Objective:    Vitals with BMI 11/10/2020 12/06/2019 10/22/2019  Height 5' 2"  5' 2"  5' 2"   Weight 176 lbs 3 oz 165 lbs 10 oz 167 lbs 11 oz  BMI 32.22 82.95 62.13  Systolic 086 578 469  Diastolic 73 85 81  Pulse 67 80 76  Some encounter information is confidential and restricted. Go to Review Flowsheets activity to see all data.    General appearance: alert and no distress Abdomen: soft, non-tender.     Labs:  No new labs Assessment:   Weight management Obesity, Body mass index is 32.23 kg/m.  Plan:   Discussed options for weight loss management.  Risks and benefits and side effects of medication, such as Adipex (Phenteramine),  Belviq  (lorcarsin), Contrave (buproprion/naltrexone), Qsymia (phentermine/topiramate), Topiramate alone, Saxenda (liraglutide), Ozempic and Wegovy (semaglutide), were discussed. The pros and cons of suppressing appetite and boosting metabolism is discussed. Risks of tolerence and addiction is discussed for selected agents discussed. Use of medicine will ne short term, such as 3-4 months at a time followed by a period of time off of the medicine to avoid these risks and side effects for Adipex discussed. After review of all medication options, she notes that she would be ok to try [facility] or Topamax. Will start with Topamax as Mancel Parsons is currently on backorder. If no improvement in weight loss, will then trial [facility].  Pt to call with any negative side effects and agrees to keep follow up appts. Return to clinic for any scheduled appointments or for any gynecologic concerns as needed. Has annual exam scheduled in several weeks.    A total of 15 minutes were spent face-to-face with the patient during this encounter and over half of that time dealt with counseling and discussion of management.   Rubie Maid, MD [facility]

## 2020-11-11 ENCOUNTER — Other Ambulatory Visit: Payer: Self-pay

## 2020-11-24 ENCOUNTER — Other Ambulatory Visit: Payer: Self-pay

## 2020-11-25 ENCOUNTER — Other Ambulatory Visit: Payer: Self-pay

## 2020-11-25 ENCOUNTER — Other Ambulatory Visit: Payer: Self-pay | Admitting: Obstetrics and Gynecology

## 2020-11-25 MED ORDER — OZEMPIC (0.25 OR 0.5 MG/DOSE) 2 MG/1.5ML ~~LOC~~ SOPN
0.5000 mg | PEN_INJECTOR | SUBCUTANEOUS | 6 refills | Status: DC
  Filled 2020-11-25: qty 1.5, 28d supply, fill #0
  Filled 2020-12-29: qty 4.5, 84d supply, fill #1
  Filled 2021-03-22: qty 1.5, 28d supply, fill #2

## 2020-12-04 ENCOUNTER — Encounter: Payer: No Typology Code available for payment source | Admitting: Obstetrics and Gynecology

## 2020-12-09 ENCOUNTER — Other Ambulatory Visit: Payer: Self-pay

## 2020-12-09 ENCOUNTER — Encounter: Payer: Self-pay | Admitting: Obstetrics and Gynecology

## 2020-12-09 ENCOUNTER — Ambulatory Visit (INDEPENDENT_AMBULATORY_CARE_PROVIDER_SITE_OTHER): Payer: No Typology Code available for payment source | Admitting: Obstetrics and Gynecology

## 2020-12-09 VITALS — BP 120/78 | HR 65 | Resp 16 | Ht 62.0 in | Wt 177.5 lb

## 2020-12-09 DIAGNOSIS — Z1211 Encounter for screening for malignant neoplasm of colon: Secondary | ICD-10-CM | POA: Diagnosis not present

## 2020-12-09 DIAGNOSIS — Z1231 Encounter for screening mammogram for malignant neoplasm of breast: Secondary | ICD-10-CM | POA: Diagnosis not present

## 2020-12-09 DIAGNOSIS — E669 Obesity, unspecified: Secondary | ICD-10-CM

## 2020-12-09 DIAGNOSIS — Z131 Encounter for screening for diabetes mellitus: Secondary | ICD-10-CM

## 2020-12-09 DIAGNOSIS — Z01419 Encounter for gynecological examination (general) (routine) without abnormal findings: Secondary | ICD-10-CM | POA: Diagnosis not present

## 2020-12-09 DIAGNOSIS — E559 Vitamin D deficiency, unspecified: Secondary | ICD-10-CM

## 2020-12-09 NOTE — Progress Notes (Signed)
GYNECOLOGY ANNUAL PHYSICAL EXAM PROGRESS NOTE  Subjective:    Lauren Anthony is a 50 y.o. [phone] female who presents for an annual exam. The patient has no complaints today. The patient is sexually active. The patient wears seatbelts: yes. The patient participates in regular exercise: yes. Has the patient ever been transfused or tattooed?: yes. She reports no domestic violence in her life.   Is currently on Ozempic for weight loss, starting second week.  Notes some nausea but otherwise doing well. Is still trying to work out, working on cleaning up her diet more.    Gynecologic History  Menarche age: 65 Patient's last menstrual period was 12/14/2014.  She is s/p hysterectomy. Contraception: status post hysterectomy History of STI's: Denies Last Pap: 2016. Results were: normal.  Denies h/o abnormal pap smears. Is s/p hysterectomy.  Last mammogram: 01/30/2020. Results were: normal   OB History  Gravida Para Term Preterm AB Living  3 3 1 2  0 3  SAB IAB Ectopic Multiple Live Births  0 0 0 0 3    # Outcome Date GA Lbr Len/2nd Weight Sex Delivery Anes PTL Lv  3 Preterm 11/21/99 [redacted]w[redacted]d  F CS-Unspec   LIV  2 Preterm 01/03/93 360w0d M VBAC  Y LIV  1 Term 08/12/87    F CS-Unspec   LIV    Past Medical History:  Diagnosis Date   Cholelithiases 2016   Heavy periods     Past Surgical History:  Procedure Laterality Date   BILATERAL SALPINGECTOMY Bilateral 12/22/2014   Procedure: BILATERAL SALPINGECTOMY;  Surgeon: AnRubie MaidMD;  Location: [facility];  Service: Gynecology;  Laterality: Bilateral;   CESAREAN SECTION     CHOLECYSTECTOMY     CYSTOSCOPY  12/22/2014   Procedure: CYSTOSCOPY;  Surgeon: AnRubie MaidMD;  Location: [facility];  Service: Gynecology;;   ENDOMETRIAL ABLATION     LAPAROSCOPIC GASTRIC SLEEVE RESECTION     LAPAROSCOPIC HYSTERECTOMY  12/22/2014   Procedure: HYSTERECTOMY TOTAL LAPAROSCOPIC;  Surgeon: AnRubie MaidMD;  Location: [facility];  Service: Gynecology;;    NORebbeca PaulBLATION  06/2014    Family History  Problem Relation Age of Onset   Lung cancer Mother    Cancer Neg Hx    Heart disease Neg Hx    Diabetes Neg Hx    Breast cancer Neg Hx     Social History   Socioeconomic History   Marital status: Married    Spouse name: Not on file   Number of children: Not on file   Years of education: Not on file   Highest education level: Not on file  Occupational History   Occupation: CLERICAL    Employer: [employer]  Tobacco Use   Smoking status: Never   Smokeless tobacco: Never  Vaping Use   Vaping Use: Never used  Substance and Sexual Activity   Alcohol use: Not Currently    Alcohol/week: 0.0 standard drinks    Comment: rare   Drug use: No   Sexual activity: Yes    Partners: Male    Birth control/protection: Surgical  Other Topics Concern   Not on file  Social History Narrative   Not on file   Social Determinants of Health   Financial Resource Strain: Not on file  Food Insecurity: Not on file  Transportation Needs: Not on file  Physical Activity: Not on file  Stress: Not on file  Social Connections: Not on file  Intimate Partner Violence: Not on file  Current Outpatient Medications on File Prior to Visit  Medication Sig Dispense Refill   Semaglutide,0.25 or 0.5MG/DOS, (OZEMPIC, 0.25 OR 0.5 MG/DOSE,) 2 MG/1.5ML SOPN Inject 0.5 mg into the skin once a week. Start with 0.25 mg weekly x 4 weeks, then increase to 0.5 mg weekly. 1.5 mL 6   topiramate (TOPAMAX) 50 MG tablet Take 1 tablet (50 mg total) by mouth 2 (two) times daily. Start with 1 tablet daily x 1 week, then increase to 2 tablets daily 60 tablet 3   No current facility-administered medications on file prior to visit.    No Known Allergies   Review of Systems Constitutional: negative for chills, fatigue, fevers and sweats Eyes: negative for irritation, redness and visual disturbance Ears, nose, mouth, throat, and face: negative for hearing loss, nasal congestion,  snoring and tinnitus Respiratory: negative for asthma, cough, sputum Cardiovascular: negative for chest pain, dyspnea, exertional chest pressure/discomfort, irregular heart beat, palpitations and syncope Gastrointestinal: negative for abdominal pain, change in bowel habits, nausea and vomiting Genitourinary: negative for abnormal menstrual periods, genital lesions, sexual problems and vaginal discharge, dysuria and urinary incontinence Integument/breast: negative for breast lump, breast tenderness and nipple discharge Hematologic/lymphatic: negative for bleeding and easy bruising Musculoskeletal:negative for back pain and muscle weakness Neurological: negative for dizziness, headaches, vertigo and weakness Endocrine: negative for diabetic symptoms including polydipsia, polyuria and skin dryness Allergic/Immunologic: negative for hay fever and urticaria      Objective:  Blood pressure 120/78, pulse 65, resp. rate 16, height 5' 2"  (1.575 m), weight 177 lb 8 oz (80.5 kg), last menstrual period 12/14/2014. Body mass index is 32.47 kg/m.  General Appearance:    Alert, cooperative, no distress, appears stated age  Head:    Normocephalic, without obvious abnormality, atraumatic  Eyes:    PERRL, conjunctiva/corneas clear, EOM's intact, both eyes  Ears:    Normal external ear canals, both ears  Nose:   Nares normal, septum midline, mucosa normal, no drainage or sinus tenderness  Throat:   Lips, mucosa, and tongue normal; teeth and gums normal  Neck:   Supple, symmetrical, trachea midline, no adenopathy; thyroid: no enlargement/tenderness/nodules; no carotid bruit or JVD  Back:     Symmetric, no curvature, ROM normal, no CVA tenderness  Lungs:     Clear to auscultation bilaterally, respirations unlabored  Chest Wall:    No tenderness or deformity   Heart:    Regular rate and rhythm, S1 and S2 normal, no murmur, rub or gallop  Breast Exam:    No tenderness, masses, or nipple abnormality  Abdomen:      Soft, non-tender, bowel sounds active all four quadrants, no masses, no organomegaly.    Genitalia:    Pelvic:external genitalia normal, vagina without lesions, discharge, or tenderness, rectovaginal septum  normal. Cervix normal in appearance, no cervical motion tenderness, no adnexal masses or tenderness.  Uterus normal size, shape, mobile, regular contours, nontender.  Rectal:    Normal external sphincter.  No hemorrhoids appreciated. Internal exam not done.   Extremities:   Extremities normal, atraumatic, no cyanosis or edema  Pulses:   2+ and symmetric all extremities  Skin:   Skin color, texture, turgor normal, no rashes or lesions  Lymph nodes:   Cervical, supraclavicular, and axillary nodes normal  Neurologic:   CNII-XII intact, normal strength, sensation and reflexes throughout    Labs:  Lab Results  Component Value Date   WBC 5.1 11/14/2018   HGB 14.2 11/14/2018   HCT 42.8 11/14/2018  MCV 89 11/14/2018   PLT 248 11/14/2018    Lab Results  Component Value Date   CREATININE 0.85 11/14/2018   BUN 10 11/14/2018   NA 142 11/14/2018   K 4.7 11/14/2018   CL 106 11/14/2018   CO2 23 11/14/2018    Lab Results  Component Value Date   ALT 18 11/14/2018   AST 17 11/14/2018   ALKPHOS 56 11/14/2018   BILITOT 0.4 11/14/2018    Lab Results  Component Value Date   TSH 2.300 11/14/2018     Assessment:   1. Encounter for well woman exam with routine gynecological exam   2. Colon cancer screening   3. Diabetes mellitus screening   4. Encounter for screening mammogram for malignant neoplasm of breast   5. Vitamin D deficiency   6. Obesity (BMI 30.0-34.9)      Plan:  Blood tests: see orders.  Breast self exam technique reviewed and patient encouraged to perform self-exam monthly. Discussed healthy lifestyle modifications. Mammogram up to date. Next due in September. Ordered.  Pap smears no longer required, has had a hysterectomy.  H/o Vitamin D deficiency, will check  levels.  Obesity, Ozempic recently started. Will f/u in 2 months for surveillance.  Follow up in 1 year for annual exam   Rubie Maid, MD [facility]

## 2020-12-10 LAB — COMPREHENSIVE METABOLIC PANEL
ALT: 14 IU/L (ref 0–32)
AST: 13 IU/L (ref 0–40)
Albumin/Globulin Ratio: 1.7 (ref 1.2–2.2)
Albumin: 4.5 g/dL (ref 3.8–4.8)
Alkaline Phosphatase: 61 IU/L (ref 44–121)
BUN/Creatinine Ratio: 15 (ref 9–23)
BUN: 12 mg/dL (ref 6–24)
Bilirubin Total: 0.4 mg/dL (ref 0.0–1.2)
CO2: 21 mmol/L (ref 20–29)
Calcium: 9.9 mg/dL (ref 8.7–10.2)
Chloride: 102 mmol/L (ref 96–106)
Creatinine, Ser: 0.8 mg/dL (ref 0.57–1.00)
Globulin, Total: 2.7 g/dL (ref 1.5–4.5)
Glucose: 78 mg/dL (ref 65–99)
Potassium: 4.6 mmol/L (ref 3.5–5.2)
Sodium: 140 mmol/L (ref 134–144)
Total Protein: 7.2 g/dL (ref 6.0–8.5)
eGFR: 90 mL/min/{1.73_m2} (ref >59)

## 2020-12-10 LAB — CBC
Hematocrit: 42.9 % (ref 34.0–46.6)
Hemoglobin: 14.3 g/dL (ref 11.1–15.9)
MCH: 29.2 pg (ref 26.6–33.0)
MCHC: 33.3 g/dL (ref 31.5–35.7)
MCV: 88 fL (ref 79–97)
Platelets: 258 10*3/uL (ref 150–450)
RBC: 4.89 x10E6/uL (ref 3.77–5.28)
RDW: 12.9 % (ref 11.7–15.4)
WBC: 7.8 10*3/uL (ref 3.4–10.8)

## 2020-12-10 LAB — LIPID PANEL
Chol/HDL Ratio: 3.2 ratio (ref 0.0–4.4)
Cholesterol, Total: 187 mg/dL (ref 100–199)
HDL: 58 mg/dL (ref >39)
LDL Chol Calc (NIH): 108 mg/dL — ABNORMAL HIGH (ref 0–99)
Triglycerides: 118 mg/dL (ref 0–149)
VLDL Cholesterol Cal: 21 mg/dL (ref 5–40)

## 2020-12-10 LAB — VITAMIN D 25 HYDROXY (VIT D DEFICIENCY, FRACTURES): Vit D, 25-Hydroxy: 30.6 ng/mL (ref 30.0–100.0)

## 2020-12-10 LAB — HEMOGLOBIN A1C
Est. average glucose Bld gHb Est-mCnc: 100 mg/dL
Hgb A1c MFr Bld: 5.1 % (ref 4.8–5.6)

## 2020-12-10 LAB — TSH: TSH: 2.37 u[IU]/mL (ref 0.450–4.500)

## 2020-12-11 ENCOUNTER — Encounter: Payer: No Typology Code available for payment source | Admitting: Obstetrics and Gynecology

## 2020-12-29 ENCOUNTER — Other Ambulatory Visit: Payer: Self-pay

## 2021-01-12 LAB — COLOGUARD: Cologuard: NEGATIVE

## 2021-02-01 ENCOUNTER — Ambulatory Visit
Admission: RE | Admit: 2021-02-01 | Discharge: 2021-02-01 | Disposition: A | Discharge status: Home / Self Care | Payer: No Typology Code available for payment source | Source: Ambulatory Visit | Attending: Obstetrics and Gynecology | Admitting: Obstetrics and Gynecology

## 2021-02-01 ENCOUNTER — Other Ambulatory Visit: Payer: Self-pay

## 2021-02-01 DIAGNOSIS — Z1231 Encounter for screening mammogram for malignant neoplasm of breast: Secondary | ICD-10-CM | POA: Insufficient documentation

## 2021-03-09 ENCOUNTER — Other Ambulatory Visit: Payer: Self-pay

## 2021-03-09 MED ORDER — CARESTART COVID-19 HOME TEST VI KIT
PACK | 0 refills | Status: DC
  Filled 2021-03-09: qty 2, 4d supply, fill #0

## 2021-03-22 ENCOUNTER — Other Ambulatory Visit: Payer: Self-pay

## 2021-04-14 ENCOUNTER — Other Ambulatory Visit: Payer: Self-pay | Admitting: Obstetrics and Gynecology

## 2021-04-14 ENCOUNTER — Encounter: Payer: Self-pay | Admitting: Obstetrics and Gynecology

## 2021-04-14 MED ORDER — OZEMPIC (1 MG/DOSE) 4 MG/3ML ~~LOC~~ SOPN
1.0000 mg | PEN_INJECTOR | SUBCUTANEOUS | 6 refills | Status: DC
Start: 2021-04-14 — End: 2021-06-08
  Filled 2021-04-14: qty 9, 84d supply, fill #0

## 2021-04-15 ENCOUNTER — Other Ambulatory Visit: Payer: Self-pay

## 2021-05-03 ENCOUNTER — Telehealth: Payer: No Typology Code available for payment source | Admitting: Nurse Practitioner

## 2021-05-03 ENCOUNTER — Other Ambulatory Visit: Payer: Self-pay

## 2021-05-03 DIAGNOSIS — J069 Acute upper respiratory infection, unspecified: Secondary | ICD-10-CM | POA: Diagnosis not present

## 2021-05-03 MED ORDER — FLUTICASONE PROPIONATE 50 MCG/ACT NA SUSP
2.0000 | Freq: Every day | NASAL | 0 refills | Status: DC
  Filled 2021-05-03: qty 16, 30d supply, fill #0

## 2021-05-03 NOTE — Progress Notes (Signed)
E-Visit for Sinus Problems  We are sorry that you are not feeling well.  Here is how we plan to help!  Based on what you have shared with me it looks like you have sinusitis.  Sinusitis is inflammation and infection in the sinus cavities of the head.  Based on your presentation I believe you most likely have Acute Viral Sinusitis.This is an infection most likely caused by a virus.  Providers prescribe antibiotics to treat infections caused by bacteria. Antibiotics are very powerful in treating bacterial infections when they are used properly. To maintain their effectiveness, they should be used only when necessary. Overuse of antibiotics has resulted in the development of superbugs that are resistant to treatment!    After careful review of your answers, I would not recommend an antibiotic for your condition.  Antibiotics are not effective against viruses and therefore should not be used to treat them. Common examples of infections caused by viruses include colds and flu   There is not specific treatment for viral sinusitis other than to help you with the symptoms until the infection runs its course.  You may use an oral decongestant such as Mucinex D or if you have glaucoma or high blood pressure use plain Mucinex. Saline nasal spray help and can safely be used as often as needed for congestion, I have prescribed: Fluticasone nasal spray two sprays in each nostril once a day  Some authorities believe that zinc sprays or the use of Echinacea may shorten the course of your symptoms.  Sinus infections are not as easily transmitted as other respiratory infection, however we still recommend that you avoid close contact with loved ones, especially the very young and elderly.  Remember to wash your hands thoroughly throughout the day as this is the number one way to prevent the spread of infection!  Home Care: Only take medications as instructed by your medical team. Do not take these medications with  alcohol. A steam or ultrasonic humidifier can help congestion.  You can place a towel over your head and breathe in the steam from hot water coming from a faucet. Avoid close contacts especially the very young and the elderly. Cover your mouth when you cough or sneeze. Always remember to wash your hands.  Get Help Right Away If: You develop worsening fever or sinus pain. You develop a severe head ache or visual changes. Your symptoms persist after you have completed your treatment plan.  Make sure you Understand these instructions. Will watch your condition. Will get help right away if you are not doing well or get worse.   Thank you for choosing an e-visit.  Your e-visit answers were reviewed by a board certified advanced clinical practitioner to complete your personal care plan. Depending upon the condition, your plan could have included both over the counter or prescription medications.  Please review your pharmacy choice. Make sure the pharmacy is open so you can pick up prescription now. If there is a problem, you may contact your provider through [employer] and have the prescription routed to another pharmacy.  Your safety is important to [place]. If you have drug allergies check your prescription carefully.   For the next 24 hours you can use MyChart to ask questions about today's visit, request a non-urgent call back, or ask for a work or school excuse. You will get an email in the next two days asking about your experience. I hope that your e-visit has been valuable and will speed your  recovery.  I spent approximately 7 minutes reviewing the patient's history, current symptoms and coordinating their plan of care today.    Meds ordered this encounter  Medications   fluticasone (FLONASE) 50 MCG/ACT nasal spray    Sig: Place 2 sprays into both nostrils daily.    Dispense:  16 g    Refill:  0

## 2021-05-04 ENCOUNTER — Other Ambulatory Visit: Payer: Self-pay

## 2021-06-08 ENCOUNTER — Encounter: Payer: Self-pay | Admitting: Obstetrics and Gynecology

## 2021-06-08 ENCOUNTER — Other Ambulatory Visit: Payer: Self-pay

## 2021-06-08 MED ORDER — SEMAGLUTIDE-WEIGHT MANAGEMENT 1.7 MG/0.75ML ~~LOC~~ SOAJ
1.7000 mg | SUBCUTANEOUS | 0 refills | Status: AC
  Filled 2021-06-08 – 2021-09-27 (×2): qty 3, 28d supply, fill #0

## 2021-06-08 MED ORDER — SEMAGLUTIDE-WEIGHT MANAGEMENT 1 MG/0.5ML ~~LOC~~ SOAJ
1.0000 mg | SUBCUTANEOUS | 0 refills | Status: AC
  Filled 2021-06-08 – 2021-09-03 (×3): qty 2, 28d supply, fill #0

## 2021-06-08 MED ORDER — SEMAGLUTIDE-WEIGHT MANAGEMENT 0.5 MG/0.5ML ~~LOC~~ SOAJ
0.5000 mg | SUBCUTANEOUS | 0 refills | Status: AC
  Filled 2021-06-08 – 2021-07-28 (×2): qty 2, 28d supply, fill #0

## 2021-06-08 MED ORDER — SEMAGLUTIDE-WEIGHT MANAGEMENT 0.25 MG/0.5ML ~~LOC~~ SOAJ
0.2500 mg | SUBCUTANEOUS | 0 refills | Status: AC
  Filled 2021-06-08 – 2021-07-06 (×9): qty 2, 28d supply, fill #0

## 2021-06-08 MED ORDER — SEMAGLUTIDE-WEIGHT MANAGEMENT 2.4 MG/0.75ML ~~LOC~~ SOAJ
2.4000 mg | SUBCUTANEOUS | 6 refills | Status: DC
  Filled 2021-06-08: qty 3, fill #0
  Filled 2021-11-01 – 2021-11-10 (×3): qty 3, 28d supply, fill #0
  Filled 2021-12-05: qty 3, 28d supply, fill #1
  Filled 2022-01-01: qty 3, 28d supply, fill #2
  Filled 2022-01-31: qty 3, 28d supply, fill #3
  Filled 2022-03-10: qty 3, 28d supply, fill #4

## 2021-06-10 ENCOUNTER — Other Ambulatory Visit: Payer: Self-pay

## 2021-06-11 ENCOUNTER — Other Ambulatory Visit: Payer: Self-pay

## 2021-06-17 ENCOUNTER — Other Ambulatory Visit: Payer: Self-pay

## 2021-06-22 ENCOUNTER — Other Ambulatory Visit: Payer: Self-pay

## 2021-06-23 ENCOUNTER — Other Ambulatory Visit: Payer: Self-pay

## 2021-06-28 ENCOUNTER — Encounter: Payer: Self-pay | Admitting: Obstetrics and Gynecology

## 2021-06-29 ENCOUNTER — Other Ambulatory Visit: Payer: Self-pay

## 2021-06-30 ENCOUNTER — Encounter: Payer: Self-pay | Admitting: Obstetrics and Gynecology

## 2021-07-05 ENCOUNTER — Other Ambulatory Visit: Payer: Self-pay

## 2021-07-06 ENCOUNTER — Other Ambulatory Visit: Payer: Self-pay

## 2021-07-07 ENCOUNTER — Other Ambulatory Visit: Payer: Self-pay

## 2021-07-07 ENCOUNTER — Other Ambulatory Visit: Payer: Self-pay | Admitting: Pharmacist

## 2021-07-07 MED ORDER — CARESTART COVID-19 HOME TEST VI KIT
PACK | 0 refills | Status: DC
  Filled 2021-07-07: qty 2, 4d supply, fill #0

## 2021-07-28 ENCOUNTER — Other Ambulatory Visit: Payer: Self-pay

## 2021-08-16 ENCOUNTER — Telehealth: Payer: No Typology Code available for payment source | Admitting: Physician Assistant

## 2021-08-16 DIAGNOSIS — J020 Streptococcal pharyngitis: Secondary | ICD-10-CM

## 2021-08-16 MED ORDER — AMOXICILLIN 500 MG PO CAPS
500.0000 mg | ORAL_CAPSULE | Freq: Two times a day (BID) | ORAL | 0 refills | Status: AC
  Filled 2021-08-16: qty 20, 10d supply, fill #0

## 2021-08-16 NOTE — Progress Notes (Signed)
E-Visit for Sore Throat - Strep Symptoms ? ?We are sorry that you are not feeling well.  Here is how we plan to help! ? ?Based on what you have shared with me it is likely that you have strep pharyngitis.  Strep pharyngitis is inflammation and infection in the back of the throat.  This is an infection cause by bacteria and is treated with antibiotics.  I have prescribed Amoxicillin 500 mg twice a day for 10 days. For throat pain, we recommend over the counter oral pain relief medications such as acetaminophen or aspirin, or anti-inflammatory medications such as ibuprofen or naproxen sodium. Topical treatments such as oral throat lozenges or sprays may be used as needed. Strep infections are not as easily transmitted as other respiratory infections, however we still recommend that you avoid close contact with loved ones, especially the very young and elderly.  Remember to wash your hands thoroughly throughout the day as this is the number one way to prevent the spread of infection and wipe down door knobs and counters with disinfectant. ? ? ?Home Care: ?Only take medications as instructed by your medical team. ?Complete the entire course of an antibiotic. ?Do not take these medications with alcohol. ?A steam or ultrasonic humidifier can help congestion.  You can place a towel over your head and breathe in the steam from hot water coming from a faucet. ?Avoid close contacts especially the very young and the elderly. ?Cover your mouth when you cough or sneeze. ?Always remember to wash your hands. ? ?Get Help Right Away If: ?You develop worsening fever or sinus pain. ?You develop a severe head ache or visual changes. ?Your symptoms persist after you have completed your treatment plan. ? ?Make sure you ?Understand these instructions. ?Will watch your condition. ?Will get help right away if you are not doing well or get worse. ? ? ?Thank you for choosing an e-visit. ? ?Your e-visit answers were reviewed by a board  certified advanced clinical practitioner to complete your personal care plan. Depending upon the condition, your plan could have included both over the counter or prescription medications. ? ?Please review your pharmacy choice. Make sure the pharmacy is open so you can pick up prescription now. If there is a problem, you may contact your provider through [employer] and have the prescription routed to another pharmacy.  Your safety is important to [place]. If you have drug allergies check your prescription carefully.  ? ?For the next 24 hours you can use MyChart to ask questions about today's visit, request a non-urgent call back, or ask for a work or school excuse. ?You will get an email in the next two days asking about your experience. I hope that your e-visit has been valuable and will speed your recovery. ? ? ?I provided 5 minutes of non face-to-face time during this encounter for chart review and documentation.  ? ?

## 2021-08-17 ENCOUNTER — Other Ambulatory Visit: Payer: Self-pay

## 2021-08-30 ENCOUNTER — Encounter: Payer: Self-pay | Admitting: Pharmacist

## 2021-08-30 ENCOUNTER — Other Ambulatory Visit: Payer: Self-pay

## 2021-09-03 ENCOUNTER — Other Ambulatory Visit: Payer: Self-pay

## 2021-09-03 ENCOUNTER — Telehealth: Payer: No Typology Code available for payment source | Admitting: Emergency Medicine

## 2021-09-03 DIAGNOSIS — J029 Acute pharyngitis, unspecified: Secondary | ICD-10-CM | POA: Diagnosis not present

## 2021-09-03 NOTE — Progress Notes (Signed)
?E-Visit for Sore Throat ? ?We are sorry that you are not feeling well.  Here is how we plan to help! ? ?Your symptoms indicate a likely viral infection (Pharyngitis).   ? ?If you run a fever >101 or worsen over the weekend, you should be seen in person.  There are other causes of sore throat that may need to be rule out such as mono, uvulitis, tonsillitis, peritonsillar abscess. ? ?However, with having just been on antibiotics, this is likely viral and I recommend supportive home remedies as listed below. ? ?Pharyngitis is inflammation in the back of the throat which can cause a sore throat, scratchiness and sometimes difficulty swallowing.   Pharyngitis is typically caused by a respiratory virus and will just run its course.  Please keep in mind that your symptoms could last up to 10 days.  For throat pain, we recommend over the counter oral pain relief medications such as acetaminophen or aspirin, or anti-inflammatory medications such as ibuprofen or naproxen sodium.  Topical treatments such as oral throat lozenges or sprays may be used as needed.  Avoid close contact with loved ones, especially the very young and elderly.  Remember to wash your hands thoroughly throughout the day as this is the number one way to prevent the spread of infection and wipe down door knobs and counters with disinfectant. ? ?After careful review of your answers, I would not recommend an antibiotic for your condition.  Antibiotics should not be used to treat conditions that we suspect are caused by viruses like the virus that causes the common cold or flu. However, some people can have Strep with atypical symptoms. You may need formal testing in clinic or office to confirm if your symptoms continue or worsen. ? ?Providers prescribe antibiotics to treat infections caused by bacteria. Antibiotics are very powerful in treating bacterial infections when they are used properly.  To maintain their effectiveness, they should be used only  when necessary.  Overuse of antibiotics has resulted in the development of super bugs that are resistant to treatment!   ? ?Home Care: ?Only take medications as instructed by your medical team. ?Do not drink alcohol while taking these medications. ?A steam or ultrasonic humidifier can help congestion.  You can place a towel over your head and breathe in the steam from hot water coming from a faucet. ?Avoid close contacts especially the very young and the elderly. ?Cover your mouth when you cough or sneeze. ?Always remember to wash your hands. ? ?Get Help Right Away If: ?You develop worsening fever or throat pain. ?You develop a severe head ache or visual changes. ?Your symptoms persist after you have completed your treatment plan. ? ?Make sure you ?Understand these instructions. ?Will watch your condition. ?Will get help right away if you are not doing well or get worse. ? ? ?Thank you for choosing an e-visit. ? ?Your e-visit answers were reviewed by a board certified advanced clinical practitioner to complete your personal care plan. Depending upon the condition, your plan could have included both over the counter or prescription medications. ? ?Please review your pharmacy choice. Make sure the pharmacy is open so you can pick up prescription now. If there is a problem, you may contact your provider through [employer] and have the prescription routed to another pharmacy.  Your safety is important to [place]. If you have drug allergies check your prescription carefully.  ? ?For the next 24 hours you can use MyChart to ask questions about today's  visit, request a non-urgent call back, or ask for a work or school excuse. ?You will get an email in the next two days asking about your experience. I hope that your e-visit has been valuable and will speed your recovery. ? ?Approximately 5 minutes was used in reviewing the patient's chart, questionnaire, prescribing medications, and documentation. ? ?

## 2021-09-05 ENCOUNTER — Telehealth: Payer: No Typology Code available for payment source | Admitting: Nurse Practitioner

## 2021-09-05 DIAGNOSIS — J0100 Acute maxillary sinusitis, unspecified: Secondary | ICD-10-CM

## 2021-09-05 MED ORDER — AMOXICILLIN-POT CLAVULANATE 875-125 MG PO TABS
1.0000 | ORAL_TABLET | Freq: Two times a day (BID) | ORAL | 0 refills | Status: DC
  Filled 2021-09-05: qty 14, 7d supply, fill #0

## 2021-09-05 NOTE — Progress Notes (Signed)
E-Visit for Sinus Problems ? ?We are sorry that you are not feeling well.  Here is how we plan to help! ? ?Based on what you have shared with me it looks like you have sinusitis.  Sinusitis is inflammation and infection in the sinus cavities of the head.  Based on your presentation I believe you most likely have Acute Bacterial Sinusitis.  This is an infection caused by bacteria and is treated with antibiotics. I have prescribed Augmentin 875m/125mg one tablet twice daily with food, for 7 days. You may use an oral decongestant such as Mucinex D or if you have glaucoma or high blood pressure use plain Mucinex. Saline nasal spray help and can safely be used as often as needed for congestion.  If you develop worsening sinus pain, fever or notice severe headache and vision changes, or if symptoms are not better after completion of antibiotic, please schedule an appointment with a health care provider.   ? ?Sinus infections are not as easily transmitted as other respiratory infection, however we still recommend that you avoid close contact with loved ones, especially the very young and elderly.  Remember to wash your hands thoroughly throughout the day as this is the number one way to prevent the spread of infection! ? ?Home Care: ?Only take medications as instructed by your medical team. ?Complete the entire course of an antibiotic. ?Do not take these medications with alcohol. ?A steam or ultrasonic humidifier can help congestion.  You can place a towel over your head and breathe in the steam from hot water coming from a faucet. ?Avoid close contacts especially the very young and the elderly. ?Cover your mouth when you cough or sneeze. ?Always remember to wash your hands. ? ?Get Help Right Away If: ?You develop worsening fever or sinus pain. ?You develop a severe head ache or visual changes. ?Your symptoms persist after you have completed your treatment plan. ? ?Make sure you ?Understand these instructions. ?Will watch  your condition. ?Will get help right away if you are not doing well or get worse. ? ?Thank you for choosing an e-visit. ? ?Your e-visit answers were reviewed by a board certified advanced clinical practitioner to complete your personal care plan. Depending upon the condition, your plan could have included both over the counter or prescription medications. ? ?Please review your pharmacy choice. Make sure the pharmacy is open so you can pick up prescription now. If there is a problem, you may contact your provider through [employer] have the prescription routed to another pharmacy.  Your safety is important to [place] If you have drug allergies check your prescription carefully.  ? ?For the next 24 hours you can use MyChart to ask questions about today's visit, request a non-urgent call back, or ask for a work or school excuse. ?You will get an email in the next two days asking about your experience. I hope that your e-visit has been valuable and will speed your recovery. ? ?5-10 minutes spent reviewing and documenting in chart. ? ?

## 2021-09-06 ENCOUNTER — Other Ambulatory Visit: Payer: Self-pay

## 2021-09-13 ENCOUNTER — Other Ambulatory Visit: Payer: Self-pay

## 2021-09-13 ENCOUNTER — Telehealth: Payer: No Typology Code available for payment source | Admitting: Physician Assistant

## 2021-09-13 DIAGNOSIS — T3695XA Adverse effect of unspecified systemic antibiotic, initial encounter: Secondary | ICD-10-CM

## 2021-09-13 DIAGNOSIS — B379 Candidiasis, unspecified: Secondary | ICD-10-CM | POA: Diagnosis not present

## 2021-09-13 MED ORDER — FLUCONAZOLE 150 MG PO TABS
150.0000 mg | ORAL_TABLET | ORAL | 0 refills | Status: DC | PRN
  Filled 2021-09-13: qty 2, 6d supply, fill #0

## 2021-09-13 NOTE — Progress Notes (Signed)
E-Visit for Vaginal Symptoms ? ?We are sorry that you are not feeling well. Here is how we plan to help! ?Based on what you shared with me it looks like you: May have a yeast vaginosis ? ?Vaginosis is an inflammation of the vagina that can result in discharge, itching and pain. The cause is usually a change in the normal balance of vaginal bacteria or an infection. Vaginosis can also result from reduced estrogen levels after menopause. ? ?The most common causes of vaginosis are: ? ? Bacterial vaginosis which results from an overgrowth of one on several organisms that are normally present in your vagina. ? ? Yeast infections which are caused by a naturally occurring fungus called candida. ? ? Vaginal atrophy (atrophic vaginosis) which results from the thinning of the vagina from reduced estrogen levels after menopause. ? ? Trichomoniasis which is caused by a parasite and is commonly transmitted by sexual intercourse. ? ?Factors that increase your risk of developing vaginosis include: ?Medications, such as antibiotics and steroids ?Uncontrolled diabetes ?Use of hygiene products such as bubble bath, vaginal spray or vaginal deodorant ?Douching ?Wearing damp or tight-fitting clothing ?Using an intrauterine device (IUD) for birth control ?Hormonal changes, such as those associated with pregnancy, birth control pills or menopause ?Sexual activity ?Having a sexually transmitted infection ? ?Your treatment plan is A single Diflucan (fluconazole) 151m tablet once.  I have electronically sent this prescription into the pharmacy that you have chosen. ? ?Be sure to take all of the medication as directed. Stop taking any medication if you develop a rash, tongue swelling or shortness of breath. Mothers who are breast feeding should consider pumping and discarding their breast milk while on these antibiotics. However, there is no consensus that infant exposure at these doses would be harmful.  ?Remember that medication creams can  weaken latex condoms. ?. ? ? ?HOME CARE: ? ?Good hygiene may prevent some types of vaginosis from recurring and may relieve some symptoms: ? ?Avoid baths, hot tubs and whirlpool spas. Rinse soap from your outer genital area after a shower, and dry the area well to prevent irritation. Don't use scented or harsh soaps, such as those with deodorant or antibacterial action. ?Avoid irritants. These include scented tampons and pads. ?Wipe from front to back after using the toilet. Doing so avoids spreading fecal bacteria to your vagina. ? ?Other things that may help prevent vaginosis include: ? ?Don't douche. Your vagina doesn't require cleansing other than normal bathing. Repetitive douching disrupts the normal organisms that reside in the vagina and can actually increase your risk of vaginal infection. Douching won't clear up a vaginal infection. ?Use a latex condom. Both female and female latex condoms may help you avoid infections spread by sexual contact. ?Wear cotton underwear. Also wear pantyhose with a cotton crotch. If you feel comfortable without it, skip wearing underwear to bed. Yeast thrives in moist environments ?Your symptoms should improve in the next day or two. ? ?GET HELP RIGHT AWAY IF: ? ?You have pain in your lower abdomen ( pelvic area or over your ovaries) ?You develop nausea or vomiting ?You develop a fever ?Your discharge changes or worsens ?You have persistent pain with intercourse ?You develop shortness of breath, a rapid pulse, or you faint. ? ?These symptoms could be signs of problems or infections that need to be evaluated by a medical provider now. ? ?MAKE SURE YOU  ? ?Understand these instructions. ?Will watch your condition. ?Will get help right away if you are not  doing well or get worse. ? ?Thank you for choosing an e-visit. ? ?Your e-visit answers were reviewed by a board certified advanced clinical practitioner to complete your personal care plan. Depending upon the condition, your plan  could have included both over the counter or prescription medications. ? ?Please review your pharmacy choice. Make sure the pharmacy is open so you can pick up prescription now. If there is a problem, you may contact your provider through [employer] and have the prescription routed to another pharmacy.  Your safety is important to [place]. If you have drug allergies check your prescription carefully.  ? ?For the next 24 hours you can use MyChart to ask questions about today's visit, request a non-urgent call back, or ask for a work or school excuse. ?You will get an email in the next two days asking about your experience. I hope that your e-visit has been valuable and will speed your recovery. ? ? ?I provided 5 minutes of non face-to-face time during this encounter for chart review and documentation.  ? ?

## 2021-09-27 ENCOUNTER — Other Ambulatory Visit: Payer: Self-pay

## 2021-10-29 ENCOUNTER — Encounter: Payer: Self-pay | Admitting: Obstetrics and Gynecology

## 2021-11-01 ENCOUNTER — Other Ambulatory Visit: Payer: Self-pay

## 2021-11-01 ENCOUNTER — Encounter: Payer: Self-pay | Admitting: Obstetrics and Gynecology

## 2021-11-02 ENCOUNTER — Other Ambulatory Visit: Payer: Self-pay

## 2021-11-02 MED ORDER — WEGOVY 1.7 MG/0.75ML ~~LOC~~ SOAJ
1.7000 mg | SUBCUTANEOUS | 6 refills | Status: DC
  Filled 2021-11-02 – 2021-11-08 (×2): qty 3, 28d supply, fill #0

## 2021-11-05 ENCOUNTER — Other Ambulatory Visit: Payer: Self-pay

## 2021-11-08 ENCOUNTER — Other Ambulatory Visit: Payer: Self-pay

## 2021-11-10 ENCOUNTER — Other Ambulatory Visit: Payer: Self-pay

## 2021-11-30 ENCOUNTER — Other Ambulatory Visit: Payer: Self-pay

## 2021-12-03 ENCOUNTER — Other Ambulatory Visit: Payer: Self-pay

## 2021-12-05 ENCOUNTER — Other Ambulatory Visit: Payer: Self-pay

## 2021-12-06 ENCOUNTER — Other Ambulatory Visit: Payer: Self-pay

## 2021-12-22 ENCOUNTER — Ambulatory Visit: Payer: No Typology Code available for payment source | Attending: Obstetrics and Gynecology

## 2021-12-22 DIAGNOSIS — M6281 Muscle weakness (generalized): Secondary | ICD-10-CM | POA: Insufficient documentation

## 2021-12-22 DIAGNOSIS — M25522 Pain in left elbow: Secondary | ICD-10-CM | POA: Insufficient documentation

## 2021-12-22 NOTE — Therapy (Signed)
PT/OT/SLP Screening Form   Time: in 14: 38    Time out 15: 05   Complaint L elbow pain Past Medical Hx:  n/a Injury Date:~ 3 months ago  Pain Scale: worst 7/10 Patient's phone number:   Hx (this occurrence):  Patient's pain began about three months ago. [facility] elbow pain about 3-4 weeks ago. Was told is tennis elbow and has a spur. Is doing heat and ice but not having pain reduction. Very sore in the morning. At rest 4/10 At worst: 7/10    Assessment:  Wrist ROM WFL MMT : decreased: painful with pronation/supination   Trigger Point Dry Needling (TDN), unbilled Education performed with patient regarding potential benefit of TDN. Reviewed precautions and risks with patient. Reviewed special precautions/risks over lung fields which include pneumothorax. Reviewed signs and symptoms of pneumothorax and advised pt to go to ER immediately if these symptoms develop advise them of dry needling treatment. Extensive time spent with pt to ensure full understanding of TDN risks. Pt provided verbal consent to treatment. TDN performed to  with 0.3 x 30 single needle placements with local twitch response (LTR). Pistoning technique utilized. Improved pain-free motion following intervention. Wrist flexors and extensors  Education on self trigger point with mobilization as well as ice cup massage  Recommendations:    Comments: Patient presents with signs and symptoms concurrent with tennis elbow diagnosis. TDN performed with patient educated on benefits, patient is a good candidate for additional TDN for pain reduction and return to PLOF. Recommending referral from physician for continuation of care.     '[]'$  Patient would benefit from an MD referral '[x]'$  Patient would benefit from a full PT/OT/ SLP evaluation and treatment. '[]'$  No intervention recommended at this time.

## 2021-12-27 ENCOUNTER — Encounter: Payer: Self-pay | Admitting: Obstetrics and Gynecology

## 2021-12-27 DIAGNOSIS — M7700 Medial epicondylitis, unspecified elbow: Secondary | ICD-10-CM

## 2022-01-02 ENCOUNTER — Other Ambulatory Visit: Payer: Self-pay

## 2022-01-03 ENCOUNTER — Other Ambulatory Visit: Payer: Self-pay | Admitting: Obstetrics and Gynecology

## 2022-01-03 ENCOUNTER — Other Ambulatory Visit: Payer: Self-pay

## 2022-01-03 DIAGNOSIS — Z1231 Encounter for screening mammogram for malignant neoplasm of breast: Secondary | ICD-10-CM

## 2022-01-03 NOTE — Therapy (Signed)
OUTPATIENT PHYSICAL THERAPY ELBOW  EVALUATION   Patient Name: Lauren Anthony MRN: [id number] DOB:23-Dec-1970, 51 y.o., female Today's Date: 01/04/2022   PT End of Session - 01/04/22 1555     Visit Number 1    Number of Visits 8    Date for PT Re-Evaluation 03/01/22    Authorization Type 1/10 eval 01/04/22    PT Start Time 1430    PT Stop Time 1515    PT Time Calculation (min) 45 min    Activity Tolerance Patient tolerated treatment well;Patient limited by pain    Behavior During Therapy [facility] for tasks assessed/performed             Past Medical History:  Diagnosis Date   Cholelithiases 2016   Heavy periods    Past Surgical History:  Procedure Laterality Date   BILATERAL SALPINGECTOMY Bilateral 12/22/2014   Procedure: BILATERAL SALPINGECTOMY;  Surgeon: Rubie Maid, MD;  Location: [facility];  Service: Gynecology;  Laterality: Bilateral;   CESAREAN SECTION     CHOLECYSTECTOMY     CYSTOSCOPY  12/22/2014   Procedure: CYSTOSCOPY;  Surgeon: Rubie Maid, MD;  Location: [facility];  Service: Gynecology;;   ENDOMETRIAL ABLATION     LAPAROSCOPIC GASTRIC SLEEVE RESECTION     LAPAROSCOPIC HYSTERECTOMY  12/22/2014   Procedure: HYSTERECTOMY TOTAL LAPAROSCOPIC;  Surgeon: Rubie Maid, MD;  Location: [facility];  Service: Gynecology;;   Rebbeca Paul ABLATION  06/2014   Patient Active Problem List   Diagnosis Date Noted   Obesity (BMI 30.0-34.9) 10/20/2019   Anxiety disorder 08/26/2015   Fatigue due to treatment 08/26/2015    PCP: n/a  REFERRING PROVIDER: Rubie Maid   REFERRING DIAG: Golfers Elbow   THERAPY DIAG:  Pain in left elbow  Muscle weakness (generalized)  Rationale for Evaluation and Treatment Rehabilitation  ONSET DATE: ~3 months ago   SUBJECTIVE:                                                                                                                                                                                      SUBJECTIVE STATEMENT: Patient presents  with order for L elbow pain. Has been seen for a screen by this author and recommended to get an order.   PERTINENT HISTORY: Patient's pain began about three months ago. Went [facility] for [facility] elbow pain about 3-4 weeks ago. Was told is tennis elbow and has a spur. Is doing heat and ice but not having pain reduction. Very sore in the morning, limits her work and ability to go to the gym.    PAIN:  Are you having pain? Yes: NPRS scale: 5/10 Pain location: medial aspect of  elbow  Pain description: aching Aggravating factors: mopping, movement, touch Relieving factors: relaxing, no use,   Worst pain: 6-7/10  PRECAUTIONS: None  WEIGHT BEARING RESTRICTIONS No  FALLS:  Has patient fallen in last 6 months? No  LIVING ENVIRONMENT: Lives with: lives with their family Lives in: House/apartment Stairs: No  OCCUPATION: Web designer [employer]  PLOF: Independent  PATIENT GOALS Get the pain down.   OBJECTIVE:   DIAGNOSTIC FINDINGS:  Per patient: imaging found to have bone spur   PATIENT SURVEYS:  FOTO 69; predicted discharge 78%   COGNITION:  Overall cognitive status: Within functional limits for tasks assessed     SENSATION: WFL  POSTURE: Slight rounded shoulders   UPPER EXTREMITY ROM:   Active ROM WFL however painful with all wrist movements   UPPER EXTREMITY MMT:  MMT Right eval Left eval  Shoulder flexion 5 4+  Shoulder extension 5 4+  Shoulder abduction 5 5  Elbow flexion 5 4-*  Elbow extension 5 4-*  Wrist flexion 5 4-*  Wrist extension 5 4-*  Wrist ulnar deviation 5 4-*  Wrist radial deviation 5 4-*  Wrist pronation 5 4-*  Wrist supination 5 4-*  Grip strength (lbs) WFL Limited   Pain   Elbow SPECIAL TESTS: Bicep squeeze test : negative Passive forearm pronation: negative Valgus stress test: positive Chair pushup test positive   JOINT MOBILITY TESTING:  Humeroulnar joint medial glide: painful Humeroulnar joint lateral  glide: guarding Humeroradial joint distraction: painful  Proximal radioulnar joint anterior glide of radial head: hypomobile Distal radioulnar joint posterior glide of radius: hypomobile Distal radioulnar joint anterior glide of radius: hypomobile   PALPATION:  Painful to medial aspect of elbow    TODAY'S TREATMENT:  Ice cup massage x 4 minutes  Trigger Point Dry Needling (TDN), unbilled Education performed with patient regarding potential benefit of TDN. Reviewed precautions and risks with patient. Reviewed special precautions/risks over lung fields which include pneumothorax. Reviewed signs and symptoms of pneumothorax and advised pt to go to ER immediately if these symptoms develop advise them of dry needling treatment. Extensive time spent with pt to ensure full understanding of TDN risks. Pt provided verbal consent to treatment. TDN performed to  with 0.3 x 30 single needle placements with local twitch response (LTR). Pistoning technique utilized. Improved pain-free motion following intervention. Wrist flexors and extensors x 6 minutes    PATIENT EDUCATION: Education details: HEP, ice cup massage, use of brace  Person educated: Patient Education method: Explanation, Demonstration, Tactile cues, and Verbal cues Education comprehension: verbalized understanding, returned demonstration, verbal cues required, and tactile cues required   HOME EXERCISE PROGRAM: Given at screen: wrist ROM and self trigger point release   ASSESSMENT:  CLINICAL IMPRESSION: Patient is a 51 y.o. female who was seen today for physical therapy evaluation and treatment for L medial elbow pain.  Patient has been screened by this author and recommended for evaluation due to severity of symptoms and pain levels limiting quality of life. Patient is highly motivated and is educated on use of brace during work, work place set up, and ice cup massage. Patient will benefit from skilled physical therapy to reduce pain,  improve strength, and return to PLOF.    OBJECTIVE IMPAIRMENTS decreased mobility, decreased strength, hypomobility, increased edema, impaired perceived functional ability, impaired flexibility, impaired UE functional use, improper body mechanics, postural dysfunction, and pain.   ACTIVITY LIMITATIONS carrying, lifting, dressing, reach over head, hygiene/grooming, and caring for others  PARTICIPATION LIMITATIONS: meal  prep, cleaning, laundry, driving, shopping, community activity, occupation, and yard work  PERSONAL FACTORS Age, Fitness, Past/current experiences, Profession, Time since onset of injury/illness/exacerbation, and 1-2 comorbidities: anxiety, obesity  are also affecting patient's functional outcome.   REHAB POTENTIAL: Good  CLINICAL DECISION MAKING: Stable/uncomplicated  EVALUATION COMPLEXITY: Low   GOALS: Goals reviewed with patient? Yes  SHORT TERM GOALS: Target date: 02/01/2022    Patient will be independent in home exercise program to improve strength/mobility for better functional independence with ADLs Baseline: 8/20: HEP given  Goal status: INITIAL  LONG TERM GOALS: Target date: 03/01/2022   Patient will increase FOTO score to equal to or greater than  78%   to demonstrate statistically significant improvement in mobility and quality of life Baseline: 8/29: 69% Goal status: INITIAL  2.  Patient will report a worst pain of 3/10 on VAS in L elbow  to improve tolerance with ADLs and reduced symptoms with activities.  Baseline: 8/29: 7/10 Goal status: INITIAL  3.  Patient will return to her gym program without limitations for return to PLOF.  Baseline: 8/29: unable to perform gym program  Goal status: INITIAL  PLAN: PT FREQUENCY: 1-2x/week  PT DURATION: 8 weeks  PLANNED INTERVENTIONS: Therapeutic exercises, Therapeutic activity, Neuromuscular re-education, Patient/Family education, Self Care, Joint mobilization, Dry Needling, Electrical stimulation,  Cryotherapy, Moist heat, Compression bandaging, Traction, Ultrasound, Biofeedback, Ionotophoresis '4mg'$ /ml Dexamethasone, and Manual therapy  PLAN FOR NEXT SESSION: dry needle, pain reduction.    Janna Arch, PT 01/04/2022, 3:58 PM

## 2022-01-04 ENCOUNTER — Ambulatory Visit: Payer: No Typology Code available for payment source

## 2022-01-04 DIAGNOSIS — M25522 Pain in left elbow: Secondary | ICD-10-CM

## 2022-01-04 DIAGNOSIS — M6281 Muscle weakness (generalized): Secondary | ICD-10-CM | POA: Diagnosis present

## 2022-01-04 NOTE — Progress Notes (Unsigned)
ANNUAL PREVENTATIVE CARE GYNECOLOGY  ENCOUNTER NOTE  Subjective:       Lauren Anthony is a 51 y.o. [phone] female here for a routine annual gynecologic exam. The patient is sexually active. The patient {is/is not:13135} taking hormone replacement therapy. {post-men bleed:13152::"Patient denies post-menopausal vaginal bleeding."} The patient wears seatbelts: {yes/no:311178}. The patient participates in regular exercise: {yes/no/not asked:9010}. Has the patient ever been transfused or tattooed?: {yes/no/not asked:9010}. The patient reports that there {is/is not:9024} domestic violence in her life.  Current complaints: 1.  ***    Gynecologic History Patient's last menstrual period was 12/14/2014. Contraception: status post hysterectomy Last Pap: ***. Results were: {norm/abn:16337} Last mammogram: 02/01/2021. Results were: normal Last Cologuard: 01/02/21: Negative Last Dexa Scan:    Obstetric History OB History  Gravida Para Term Preterm AB Living  _0 SAB IAB Ectopic Multiple Live Births          3    # Outcome Date GA Lbr Len/2nd Weight Sex Delivery Anes PTL Lv  3 Preterm 11/21/99 [redacted]w[redacted]d  F CS-Unspec   LIV  2 Preterm 01/03/93 370w0d M VBAC  Y LIV  1 Term 08/12/87    F CS-Unspec   LIV    Past Medical History:  Diagnosis Date   Cholelithiases 2016   Heavy periods     Family History  Problem Relation Age of Onset   Lung cancer Mother    Cancer Neg Hx    Heart disease Neg Hx    Diabetes Neg Hx    Breast cancer Neg Hx     Past Surgical History:  Procedure Laterality Date   BILATERAL SALPINGECTOMY Bilateral 12/22/2014   Procedure: BILATERAL SALPINGECTOMY;  Surgeon: AnRubie MaidMD;  Location: [facility];  Service: Gynecology;  Laterality: Bilateral;   CESAREAN SECTION     CHOLECYSTECTOMY     CYSTOSCOPY  12/22/2014   Procedure: CYSTOSCOPY;  Surgeon: AnRubie MaidMD;  Location: [facility];  Service: Gynecology;;   ENDOMETRIAL ABLATION     LAPAROSCOPIC GASTRIC  SLEEVE RESECTION     LAPAROSCOPIC HYSTERECTOMY  12/22/2014   Procedure: HYSTERECTOMY TOTAL LAPAROSCOPIC;  Surgeon: AnRubie MaidMD;  Location: [facility];  Service: Gynecology;;   NORebbeca PaulBLATION  06/2014    Social History   Socioeconomic History   Marital status: Married    Spouse name: Not on file   Number of children: Not on file   Years of education: Not on file   Highest education level: Not on file  Occupational History   Occupation: CLERICAL    Employer: [employer]  Tobacco Use   Smoking status: Never   Smokeless tobacco: Never  Vaping Use   Vaping Use: Never used  Substance and Sexual Activity   Alcohol use: Not Currently    Alcohol/week: 0.0 standard drinks of alcohol    Comment: rare   Drug use: No   Sexual activity: Yes    Partners: Male    Birth control/protection: Surgical  Other Topics Concern   Not on file  Social History Narrative   Not on file   Social Determinants of Health   Financial Resource Strain: Not on file  Food Insecurity: Not on file  Transportation Needs: Not on file  Physical Activity: Not on file  Stress: Not on file  Social Connections: Not on file  Intimate Partner Violence: Not on file    Current Outpatient Medications on File Prior to Visit  Medication Sig Dispense Refill  COVID-19 At Home Antigen Test [facility] COVID-19 HOME TEST) KIT use as directed (Patient not taking: Reported on 01/04/2022) 2 kit 0   fluconazole (DIFLUCAN) 150 MG tablet Take 1 tablet (150 mg total) by mouth every 3 (three) days as needed. (Patient not taking: Reported on 01/04/2022) 2 tablet 0   Semaglutide-Weight Management (WEGOVY) 1.7 MG/0.75ML SOAJ Inject 1.7 mg into the skin once a week. (Patient not taking: Reported on 01/04/2022) 3 mL 6   Semaglutide-Weight Management 2.4 MG/0.75ML SOAJ Inject 2.4 mg into the skin once a week for 28 days. 3 mL 6   No current facility-administered medications on file prior to visit.    No Known Allergies    Review of  Systems ROS Review of Systems - General ROS: negative for - chills, fatigue, fever, hot flashes, night sweats, weight gain or weight loss Psychological ROS: negative for - anxiety, decreased libido, depression, mood swings, physical abuse or sexual abuse Ophthalmic ROS: negative for - blurry vision, eye pain or loss of vision ENT ROS: negative for - headaches, hearing change, visual changes or vocal changes Allergy and Immunology ROS: negative for - hives, itchy/watery eyes or seasonal allergies Hematological and Lymphatic ROS: negative for - bleeding problems, bruising, swollen lymph nodes or weight loss Endocrine ROS: negative for - galactorrhea, hair pattern changes, hot flashes, malaise/lethargy, mood swings, palpitations, polydipsia/polyuria, skin changes, temperature intolerance or unexpected weight changes Breast ROS: negative for - new or changing breast lumps or nipple discharge Respiratory ROS: negative for - cough or shortness of breath Cardiovascular ROS: negative for - chest pain, irregular heartbeat, palpitations or shortness of breath Gastrointestinal ROS: no abdominal pain, change in bowel habits, or black or bloody stools Genito-Urinary ROS: no dysuria, trouble voiding, or hematuria Musculoskeletal ROS: negative for - joint pain or joint stiffness Neurological ROS: negative for - bowel and bladder control changes Dermatological ROS: negative for rash and skin lesion changes   Objective:   LMP 12/14/2014  CONSTITUTIONAL: Well-developed, well-nourished female in no acute distress.  PSYCHIATRIC: Normal mood and affect. Normal behavior. Normal judgment and thought content. [place]: Alert and oriented to person, place, and time. Normal muscle tone coordination. No cranial nerve deficit noted. HENT:  Normocephalic, atraumatic, External right and left ear normal. Oropharynx is clear and moist EYES: Conjunctivae and EOM are normal. Pupils are equal, round, and reactive to light.  No scleral icterus.  NECK: Normal range of motion, supple, no masses.  Normal thyroid.  SKIN: Skin is warm and dry. No rash noted. Not diaphoretic. No erythema. No pallor. CARDIOVASCULAR: Normal heart rate noted, regular rhythm, no murmur. RESPIRATORY: Clear to auscultation bilaterally. Effort and breath sounds normal, no problems with respiration noted. BREASTS: Symmetric in size. No masses, skin changes, nipple drainage, or lymphadenopathy. ABDOMEN: Soft, normal bowel sounds, no distention noted.  No tenderness, rebound or guarding.  BLADDER: Normal PELVIC:  Bladder {:311640}  Urethra: {:311719}  Vulva: {:311722}  Vagina: {:311643}  Cervix: {:311644}  Uterus: {:311718}  Adnexa: {:311645}  RV: {Blank multiple:19196::"External Exam NormaI","No Rectal Masses","Normal Sphincter tone"}  MUSCULOSKELETAL: Normal range of motion. No tenderness.  No cyanosis, clubbing, or edema.  2+ distal pulses. LYMPHATIC: No Axillary, Supraclavicular, or Inguinal Adenopathy.   Labs: Lab Results  Component Value Date   WBC 7.8 12/09/2020   HGB 14.3 12/09/2020   HCT 42.9 12/09/2020   MCV 88 12/09/2020   PLT 258 12/09/2020    Lab Results  Component Value Date   CREATININE 0.80 12/09/2020   BUN 12 12/09/2020  NA 140 12/09/2020   K 4.6 12/09/2020   CL 102 12/09/2020   CO2 21 12/09/2020    Lab Results  Component Value Date   ALT 14 12/09/2020   AST 13 12/09/2020   ALKPHOS 61 12/09/2020   BILITOT 0.4 12/09/2020    Lab Results  Component Value Date   CHOL 187 12/09/2020   HDL 58 12/09/2020   LDLCALC 108 (H) 12/09/2020   TRIG 118 12/09/2020   CHOLHDL 3.2 12/09/2020    Lab Results  Component Value Date   TSH 2.370 12/09/2020    Lab Results  Component Value Date   HGBA1C 5.1 12/09/2020     Assessment:   No diagnosis found.   Plan:  Pap: Not needed Mammogram: Ordered Colon Screening:   UTD Labs: {Blank multiple:19196::"Lipid 1","FBS","TSH","Hemoglobin A1C","Vit D  Level""***"} Routine preventative health maintenance measures emphasized: Exercise/Diet/Weight control, Tobacco Warnings, Alcohol/Substance use risks, Stress Management, Peer Pressure Issues, and Safe Sex COVID Vaccination status: Return to [place], [place] [facility]

## 2022-01-05 ENCOUNTER — Ambulatory Visit (INDEPENDENT_AMBULATORY_CARE_PROVIDER_SITE_OTHER): Payer: No Typology Code available for payment source | Admitting: Obstetrics and Gynecology

## 2022-01-05 ENCOUNTER — Encounter: Payer: Self-pay | Admitting: Obstetrics and Gynecology

## 2022-01-05 VITALS — BP 112/68 | HR 61 | Resp 16 | Ht 62.0 in | Wt 161.0 lb

## 2022-01-05 DIAGNOSIS — Z13220 Encounter for screening for lipoid disorders: Secondary | ICD-10-CM

## 2022-01-05 DIAGNOSIS — Z01419 Encounter for gynecological examination (general) (routine) without abnormal findings: Secondary | ICD-10-CM

## 2022-01-05 DIAGNOSIS — E663 Overweight: Secondary | ICD-10-CM | POA: Diagnosis not present

## 2022-01-05 DIAGNOSIS — Z131 Encounter for screening for diabetes mellitus: Secondary | ICD-10-CM

## 2022-01-05 NOTE — Patient Instructions (Addendum)
Preventive Care 40-51 Years Old, Female Preventive care refers to lifestyle choices and visits with your health care provider that can promote health and wellness. Preventive care visits are also called wellness exams. What can I expect for my preventive care visit? Counseling Your health care provider may ask you questions about your: Medical history, including: Past medical problems. Family medical history. Pregnancy history. Current health, including: Menstrual cycle. Method of birth control. Emotional well-being. Home life and relationship well-being. Sexual activity and sexual health. Lifestyle, including: Alcohol, nicotine or tobacco, and drug use. Access to firearms. Diet, exercise, and sleep habits. Work and work environment. Sunscreen use. Safety issues such as seatbelt and bike helmet use. Physical exam Your health care provider will check your: Height and weight. These may be used to calculate your BMI (body mass index). BMI is a measurement that tells if you are at a healthy weight. Waist circumference. This measures the distance around your waistline. This measurement also tells if you are at a healthy weight and may help predict your risk of certain diseases, such as type 2 diabetes and high blood pressure. Heart rate and blood pressure. Body temperature. Skin for abnormal spots. What immunizations do I need?  Vaccines are usually given at various ages, according to a schedule. Your health care provider will recommend vaccines for you based on your age, medical history, and lifestyle or other factors, such as travel or where you work. What tests do I need? Screening Your health care provider may recommend screening tests for certain conditions. This may include: Lipid and cholesterol levels. Diabetes screening. This is done by checking your blood sugar (glucose) after you have not eaten for a while (fasting). Pelvic exam and Pap test. Hepatitis B test. Hepatitis C  test. HIV (human immunodeficiency virus) test. STI (sexually transmitted infection) testing, if you are at risk. Lung cancer screening. Colorectal cancer screening. Mammogram. Talk with your health care provider about when you should start having regular mammograms. This may depend on whether you have a family history of breast cancer. BRCA-related cancer screening. This may be done if you have a family history of breast, ovarian, tubal, or peritoneal cancers. Bone density scan. This is done to screen for osteoporosis. Talk with your health care provider about your test results, treatment options, and if necessary, the need for more tests. Follow these instructions at home: Eating and drinking  Eat a diet that includes fresh fruits and vegetables, whole grains, lean protein, and low-fat dairy products. Take vitamin and mineral supplements as recommended by your health care provider. Do not drink alcohol if: Your health care provider tells you not to drink. You are pregnant, may be pregnant, or are planning to become pregnant. If you drink alcohol: Limit how much you have to 0-1 drink a day. Know how much alcohol is in your drink. In the [place] one drink equals one 12 oz bottle of beer (355 mL), one 5 oz glass of wine (148 mL), or one 1 oz glass of hard liquor (44 mL). Lifestyle Brush your teeth every morning and night with fluoride toothpaste. Floss one time each day. Exercise for at least 30 minutes 5 or more days each week. Do not use any products that contain nicotine or tobacco. These products include cigarettes, chewing tobacco, and vaping devices, such as e-cigarettes. If you need help quitting, ask your health care provider. Do not use drugs. If you are sexually active, practice safe sex. Use a condom or other form of protection to   prevent STIs. If you do not wish to become pregnant, use a form of birth control. If you plan to become pregnant, see your health care provider for a  prepregnancy visit. Take aspirin only as told by your health care provider. Make sure that you understand how much to take and what form to take. Work with your health care provider to find out whether it is safe and beneficial for you to take aspirin daily. Find healthy ways to manage stress, such as: Meditation, yoga, or listening to music. Journaling. Talking to a trusted person. Spending time with friends and family. Minimize exposure to UV radiation to reduce your risk of skin cancer. Safety Always wear your seat belt while driving or riding in a vehicle. Do not drive: If you have been drinking alcohol. Do not ride with someone who has been drinking. When you are tired or distracted. While texting. If you have been using any mind-altering substances or drugs. Wear a helmet and other protective equipment during sports activities. If you have firearms in your house, make sure you follow all gun safety procedures. Seek help if you have been physically or sexually abused. What's next? Visit your health care provider once a year for an annual wellness visit. Ask your health care provider how often you should have your eyes and teeth checked. Stay up to date on all vaccines. This information is not intended to replace advice given to you by your health care provider. Make sure you discuss any questions you have with your health care provider. Document Revised: 10/21/2020 Document Reviewed: 10/21/2020 Elsevier Patient Education  [place].

## 2022-01-06 LAB — COMPREHENSIVE METABOLIC PANEL
ALT: 16 IU/L (ref 0–32)
AST: 21 IU/L (ref 0–40)
Albumin/Globulin Ratio: 2.2 (ref 1.2–2.2)
Albumin: 4.6 g/dL (ref 3.9–4.9)
Alkaline Phosphatase: 62 IU/L (ref 44–121)
BUN/Creatinine Ratio: 12 (ref 9–23)
BUN: 10 mg/dL (ref 6–24)
Bilirubin Total: 0.6 mg/dL (ref 0.0–1.2)
CO2: 21 mmol/L (ref 20–29)
Calcium: 9.6 mg/dL (ref 8.7–10.2)
Chloride: 106 mmol/L (ref 96–106)
Creatinine, Ser: 0.83 mg/dL (ref 0.57–1.00)
Globulin, Total: 2.1 g/dL (ref 1.5–4.5)
Glucose: 83 mg/dL (ref 70–99)
Potassium: 4.1 mmol/L (ref 3.5–5.2)
Sodium: 141 mmol/L (ref 134–144)
Total Protein: 6.7 g/dL (ref 6.0–8.5)
eGFR: 86 mL/min/{1.73_m2} (ref >59)

## 2022-01-06 LAB — LIPID PANEL
Chol/HDL Ratio: 3.5 ratio (ref 0.0–4.4)
Cholesterol, Total: 175 mg/dL (ref 100–199)
HDL: 50 mg/dL (ref >39)
LDL Chol Calc (NIH): 109 mg/dL — ABNORMAL HIGH (ref 0–99)
Triglycerides: 87 mg/dL (ref 0–149)
VLDL Cholesterol Cal: 16 mg/dL (ref 5–40)

## 2022-01-06 LAB — CBC
Hematocrit: 41.7 % (ref 34.0–46.6)
Hemoglobin: 14.2 g/dL (ref 11.1–15.9)
MCH: 29.8 pg (ref 26.6–33.0)
MCHC: 34.1 g/dL (ref 31.5–35.7)
MCV: 88 fL (ref 79–97)
Platelets: 266 10*3/uL (ref 150–450)
RBC: 4.76 x10E6/uL (ref 3.77–5.28)
RDW: 13.1 % (ref 11.7–15.4)
WBC: 5.1 10*3/uL (ref 3.4–10.8)

## 2022-01-06 LAB — HEMOGLOBIN A1C
Est. average glucose Bld gHb Est-mCnc: 100 mg/dL
Hgb A1c MFr Bld: 5.1 % (ref 4.8–5.6)

## 2022-01-06 LAB — TSH: TSH: 2.17 u[IU]/mL (ref 0.450–4.500)

## 2022-01-18 NOTE — Therapy (Signed)
OUTPATIENT PHYSICAL THERAPY ELBOW  TREATMENT   Patient Name: Lauren Anthony MRN: [id number] DOB:Apr 05, 1971, 51 y.o., female Today's Date: 01/19/2022   PT End of Session - 01/19/22 1506     Visit Number 2    Number of Visits 8    Date for PT Re-Evaluation 03/01/22    Authorization Type 2/10 eval 01/04/22    PT Start Time 1515    PT Stop Time 1556    PT Time Calculation (min) 41 min    Activity Tolerance Patient tolerated treatment well;Patient limited by pain    Behavior During Therapy [facility] for tasks assessed/performed              Past Medical History:  Diagnosis Date   Cholelithiases 2016   Heavy periods    Past Surgical History:  Procedure Laterality Date   BILATERAL SALPINGECTOMY Bilateral 12/22/2014   Procedure: BILATERAL SALPINGECTOMY;  Surgeon: Rubie Maid, MD;  Location: [facility] ORS;  Service: Gynecology;  Laterality: Bilateral;   CESAREAN SECTION     CHOLECYSTECTOMY     CYSTOSCOPY  12/22/2014   Procedure: CYSTOSCOPY;  Surgeon: Rubie Maid, MD;  Location: [facility] OR[facility];  Service: Gynecology;;   ENDOMETRIAL ABLATION     LAPAROSCOPIC GASTRIC SLEEVE RESECTION     LAPAROSCOPIC HYSTERECTOMY  12/22/2014   Procedure: HYSTERECTOMY TOTAL LAPAROSCOPIC;  Surgeon: Rubie Maid, MD;  Location: [facility] OR[facility];  Service: Gynecology;;   Rebbeca Paul ABLATION  06/2014   Patient Active Problem List   Diagnosis Date Noted   Obesity (BMI 30.0-34.9) 10/20/2019   Anxiety disorder 08/26/2015   Fatigue due to treatment 08/26/2015    PCP: n/a  REFERRING PROVIDER: Rubie Maid   REFERRING DIAG: Golfers Elbow   THERAPY DIAG:  Pain in left elbow  Muscle weakness (generalized)  Rationale for Evaluation and Treatment Rehabilitation  ONSET DATE: ~3 months ago   SUBJECTIVE:                                                                                                                                                                                      SUBJECTIVE STATEMENT: Patient reports her  pain increased between last session and today.   PERTINENT HISTORY: Patient's pain began about three months ago. [facility] to [facility] elbow pain about 3-4 weeks ago. Was told is tennis elbow and has a spur. Is doing heat and ice but not having pain reduction. Very sore in the morning, limits her work and ability to go to the gym.    PAIN:  Are you having pain? Yes: NPRS scale: 5/10 Pain location: medial aspect of elbow  Pain description: aching Aggravating factors: mopping, movement, touch Relieving factors:  relaxing, no use,   Worst pain: 6-7/10  PRECAUTIONS: None  WEIGHT BEARING RESTRICTIONS No  FALLS:  Has patient fallen in last 6 months? No  LIVING ENVIRONMENT: Lives with: lives with their family Lives in: House/apartment Stairs: No  OCCUPATION: Web designer [employer]  PLOF: Independent  PATIENT GOALS Get the pain down.   OBJECTIVE:   DIAGNOSTIC FINDINGS:  Per patient: imaging found to have bone spur   PATIENT SURVEYS:  FOTO 69; predicted discharge 78%     UPPER EXTREMITY MMT:  MMT Right eval Left eval  Shoulder flexion 5 4+  Shoulder extension 5 4+  Shoulder abduction 5 5  Elbow flexion 5 4-*  Elbow extension 5 4-*  Wrist flexion 5 4-*  Wrist extension 5 4-*  Wrist ulnar deviation 5 4-*  Wrist radial deviation 5 4-*  Wrist pronation 5 4-*  Wrist supination 5 4-*  Grip strength (lbs) WFL Limited   Pain     TODAY'S TREATMENT:  Manual Ice cup massage x 4 minutes Scapular retraction and depression with PT overpressure x10  Therex: Theraputty squeezes 10x; finger pinch squeeze 10x  Wrist flexion/extension 1lb weight 10x; terminated with weight performed AROM 10x flexion, 10x extension  Wrist eversion/inversion on towel AAROM 10x  Scapular retraction 10x  Nerve glide median nerve LUE modified 10x Towel side bend hold with RUE 10x  Trunk extension over towel 10x Tricep cross body extension 30 second holds       Trigger Point Dry Needling (TDN), unbilled Education performed with patient regarding potential benefit of TDN. Reviewed precautions and risks with patient. Reviewed special precautions/risks over lung fields which include pneumothorax. Reviewed signs and symptoms of pneumothorax and advised pt to go to ER immediately if these symptoms develop advise them of dry needling treatment. Extensive time spent with pt to ensure full understanding of TDN risks. Pt provided verbal consent to treatment. TDN performed to  with 0.3 x 30 single needle placements with local twitch response (LTR). Pistoning technique utilized. Improved pain-free motion following intervention. Wrist flexors and extensors x 6 minutes    PATIENT EDUCATION: Education details: HEP, ice cup massage, use of brace  Person educated: Patient Education method: Explanation, Demonstration, Tactile cues, and Verbal cues Education comprehension: verbalized understanding, returned demonstration, verbal cues required, and tactile cues required   HOME EXERCISE PROGRAM: Given at screen: wrist ROM and self trigger point release   ASSESSMENT:  CLINICAL IMPRESSION: Patient will monitor symptoms s/p TDN this session to assess if beneficial or causing increased pain. Patient educated on postural interventions in combination with pain free strengthening. Significant tension of tricep noted and tricep stretch performed without pain increase. Patient given theraputty for grip strengthening training.  Patient will benefit from skilled physical therapy to reduce pain, improve strength, and return to PLOF.    OBJECTIVE IMPAIRMENTS decreased mobility, decreased strength, hypomobility, increased edema, impaired perceived functional ability, impaired flexibility, impaired UE functional use, improper body mechanics, postural dysfunction, and pain.   ACTIVITY LIMITATIONS carrying, lifting, dressing, reach over head, hygiene/grooming, and caring for  others  PARTICIPATION LIMITATIONS: meal prep, cleaning, laundry, driving, shopping, community activity, occupation, and yard work  PERSONAL FACTORS Age, Fitness, Past/current experiences, Profession, Time since onset of injury/illness/exacerbation, and 1-2 comorbidities: anxiety, obesity  are also affecting patient's functional outcome.   REHAB POTENTIAL: Good  CLINICAL DECISION MAKING: Stable/uncomplicated  EVALUATION COMPLEXITY: Low   GOALS: Goals reviewed with patient? Yes  SHORT TERM GOALS: Target date: 02/01/2022    Patient will  be independent in home exercise program to improve strength/mobility for better functional independence with ADLs Baseline: 8/20: HEP given  Goal status: INITIAL  LONG TERM GOALS: Target date: 03/01/2022   Patient will increase FOTO score to equal to or greater than  78%   to demonstrate statistically significant improvement in mobility and quality of life Baseline: 8/29: 69% Goal status: INITIAL  2.  Patient will report a worst pain of 3/10 on VAS in L elbow  to improve tolerance with ADLs and reduced symptoms with activities.  Baseline: 8/29: 7/10 Goal status: INITIAL  3.  Patient will return to her gym program without limitations for return to PLOF.  Baseline: 8/29: unable to perform gym program  Goal status: INITIAL  PLAN: PT FREQUENCY: 1-2x/week  PT DURATION: 8 weeks  PLANNED INTERVENTIONS: Therapeutic exercises, Therapeutic activity, Neuromuscular re-education, Patient/Family education, Self Care, Joint mobilization, Dry Needling, Electrical stimulation, Cryotherapy, Moist heat, Compression bandaging, Traction, Ultrasound, Biofeedback, Ionotophoresis '4mg'$ /ml Dexamethasone, and Manual therapy  PLAN FOR NEXT SESSION: dry needle, pain reduction.    Janna Arch, PT 01/19/2022, 3:57 PM

## 2022-01-19 ENCOUNTER — Ambulatory Visit: Payer: No Typology Code available for payment source | Attending: Obstetrics and Gynecology

## 2022-01-19 DIAGNOSIS — M25522 Pain in left elbow: Secondary | ICD-10-CM | POA: Insufficient documentation

## 2022-01-19 DIAGNOSIS — M6281 Muscle weakness (generalized): Secondary | ICD-10-CM | POA: Insufficient documentation

## 2022-01-26 ENCOUNTER — Ambulatory Visit: Payer: No Typology Code available for payment source

## 2022-01-31 ENCOUNTER — Other Ambulatory Visit: Payer: Self-pay

## 2022-01-31 ENCOUNTER — Telehealth: Payer: No Typology Code available for payment source | Admitting: Physician Assistant

## 2022-01-31 DIAGNOSIS — R12 Heartburn: Secondary | ICD-10-CM | POA: Diagnosis not present

## 2022-02-01 ENCOUNTER — Other Ambulatory Visit: Payer: Self-pay

## 2022-02-01 ENCOUNTER — Ambulatory Visit: Payer: No Typology Code available for payment source

## 2022-02-01 MED ORDER — OMEPRAZOLE 20 MG PO CPDR
20.0000 mg | DELAYED_RELEASE_CAPSULE | Freq: Every day | ORAL | 0 refills | Status: DC
  Filled 2022-02-01: qty 30, 30d supply, fill #0

## 2022-02-01 NOTE — Progress Notes (Signed)
E-Visit for Heartburn  We are sorry that you are not feeling well.  Here is how we plan to help!  Based on what you shared with me it looks like you most likely have Gastroesophageal Reflux Disease (GERD)  Gastroesophageal reflux disease (GERD) happens when acid from your stomach flows up into the esophagus.  When acid comes in contact with the esophagus, the acid causes sorenss (inflammation) in the esophagus.  Over time, GERD may create small holes (ulcers) in the lining of the esophagus.  I have prescribed Omeprazole 20 mg one by mouth daily until you follow up with a provider. I would recommend discussing the [facility] with your PCP -- if just starting, usually you will adjust and the heartburn will go away but can return with dose adjustments.   Your symptoms should improve in the next day or two.  You can use antacids as needed until symptoms resolve.  Call us if your heartburn worsens, you have trouble swallowing, weight loss, spitting up blood or recurrent vomiting.  Home Care: May include lifestyle changes such as weight loss, quitting smoking and alcohol consumption Avoid foods and drinks that make your symptoms worse, such as: Caffeine or alcoholic drinks Chocolate Peppermint or mint flavorings Garlic and onions Spicy foods Citrus fruits, such as oranges, lemons, or limes Tomato-based foods such as sauce, chili, salsa and pizza Fried and fatty foods Avoid lying down for 3 hours prior to your bedtime or prior to taking a nap Eat small, frequent meals instead of a large meals Wear loose-fitting clothing.  Do not wear anything tight around your waist that causes pressure on your stomach. Raise the head of your bed 6 to 8 inches with wood blocks to help you sleep.  Extra pillows will not help.  Seek Help Right Away If: You have pain in your arms, neck, jaw, teeth or back Your pain increases or changes in intensity or duration You develop nausea, vomiting or sweating  (diaphoresis) You develop shortness of breath or you faint Your vomit is green, yellow, black or looks like coffee grounds or blood Your stool is red, bloody or black  These symptoms could be signs of other problems, such as heart disease, gastric bleeding or esophageal bleeding.  Make sure you : Understand these instructions. Will watch your condition. Will get help right away if you are not doing well or get worse.  Your e-visit answers were reviewed by a board certified advanced clinical practitioner to complete your personal care plan.  Depending on the condition, your plan could have included both over the counter or prescription medications.  If there is a problem please reply  once you have received a response from your provider.  Your safety is important to [place].  If you have drug allergies check your prescription carefully.    You can use MyChart to ask questions about today's visit, request a non-urgent call back, or ask for a work or school excuse for 24 hours related to this e-Visit. If it has been greater than 24 hours you will need to follow up with your provider, or enter a new e-Visit to address those concerns.  You will get an e-mail in the next two days asking about your experience.  I hope that your e-visit has been valuable and will speed your recovery. Thank you for using e-visits.

## 2022-02-01 NOTE — Progress Notes (Signed)
I have spent 5 minutes in review of e-visit questionnaire, review and updating patient chart, medical decision making and response to patient.   Shanequa Whitenight Cody Teagan Heidrick, PA-C    

## 2022-02-02 ENCOUNTER — Ambulatory Visit
Admission: RE | Admit: 2022-02-02 | Discharge: 2022-02-02 | Disposition: A | Discharge status: Home / Self Care | Payer: No Typology Code available for payment source | Source: Ambulatory Visit | Attending: Obstetrics and Gynecology | Admitting: Obstetrics and Gynecology

## 2022-02-02 DIAGNOSIS — Z1231 Encounter for screening mammogram for malignant neoplasm of breast: Secondary | ICD-10-CM | POA: Insufficient documentation

## 2022-02-07 ENCOUNTER — Telehealth: Payer: Self-pay | Admitting: Obstetrics and Gynecology

## 2022-02-07 ENCOUNTER — Other Ambulatory Visit: Payer: Self-pay

## 2022-02-07 MED ORDER — CIPROFLOXACIN HCL 500 MG PO TABS
500.0000 mg | ORAL_TABLET | Freq: Two times a day (BID) | ORAL | 0 refills | Status: DC
  Filled 2022-02-07: qty 6, 3d supply, fill #0

## 2022-02-07 NOTE — Telephone Encounter (Signed)
Patient called to inform MD that she will be traveling out of the country for vacation ([place] Is wondering if she needs a prescription for antibiotics in case of GI issues.  Also questions if her Darcel Bayley will cause a problem as well. All questions answered. Sent prescription for Cipro for prophylaxis.

## 2022-02-09 ENCOUNTER — Ambulatory Visit: Payer: No Typology Code available for payment source

## 2022-03-10 ENCOUNTER — Encounter: Payer: Self-pay | Admitting: Obstetrics and Gynecology

## 2022-03-10 ENCOUNTER — Other Ambulatory Visit: Payer: Self-pay

## 2022-03-10 MED ORDER — SEMAGLUTIDE-WEIGHT MANAGEMENT 2.4 MG/0.75ML ~~LOC~~ SOAJ
2.4000 mg | SUBCUTANEOUS | 6 refills | Status: AC
Start: 2022-03-10 — End: 2022-10-05
  Filled 2022-03-10: qty 3, 28d supply, fill #0
  Filled 2022-04-02: qty 3, 28d supply, fill #1
  Filled 2022-05-01: qty 3, 28d supply, fill #2
  Filled 2022-06-13: qty 3, 28d supply, fill #3
  Filled 2022-07-06: qty 3, 28d supply, fill #4
  Filled 2022-07-29: qty 3, 28d supply, fill #5
  Filled 2022-08-22: qty 3, 28d supply, fill #6

## 2022-04-02 ENCOUNTER — Other Ambulatory Visit: Payer: Self-pay

## 2022-04-03 ENCOUNTER — Other Ambulatory Visit: Payer: Self-pay

## 2022-04-04 ENCOUNTER — Other Ambulatory Visit: Payer: Self-pay

## 2022-04-09 IMAGING — MG MM DIGITAL SCREENING BILAT W/ TOMO AND CAD
8 series · 8 of 24 positions shown · non-contrast
Comparison: Previous exam(s).

CLINICAL DATA: Screening.

EXAM:
DIGITAL SCREENING BILATERAL MAMMOGRAM WITH TOMOSYNTHESIS AND CAD
TECHNIQUE: Bilateral screening digital craniocaudal and mediolateral oblique
mammograms were obtained. Bilateral screening digital breast
tomosynthesis was performed. The images were evaluated with
computer-aided detection.

[R MLO synth-2D]
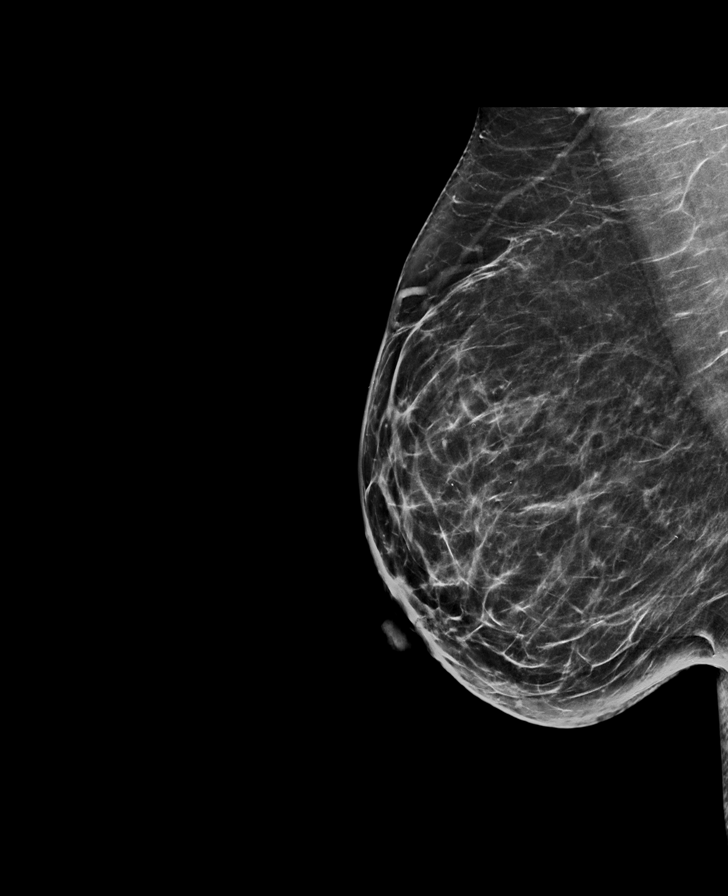

[L MLO synth-2D]
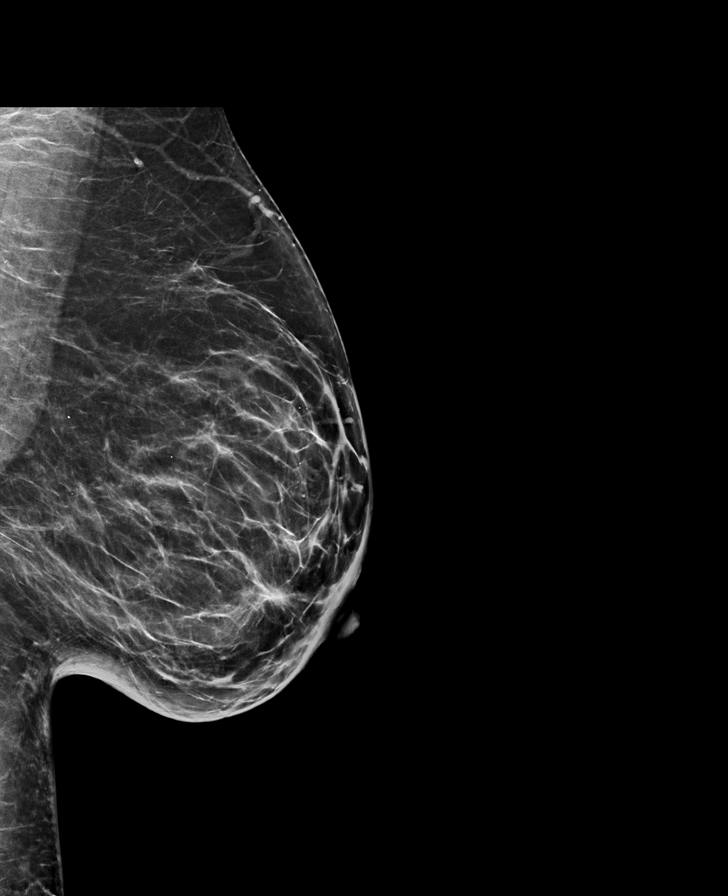

[R CC synth-2D]
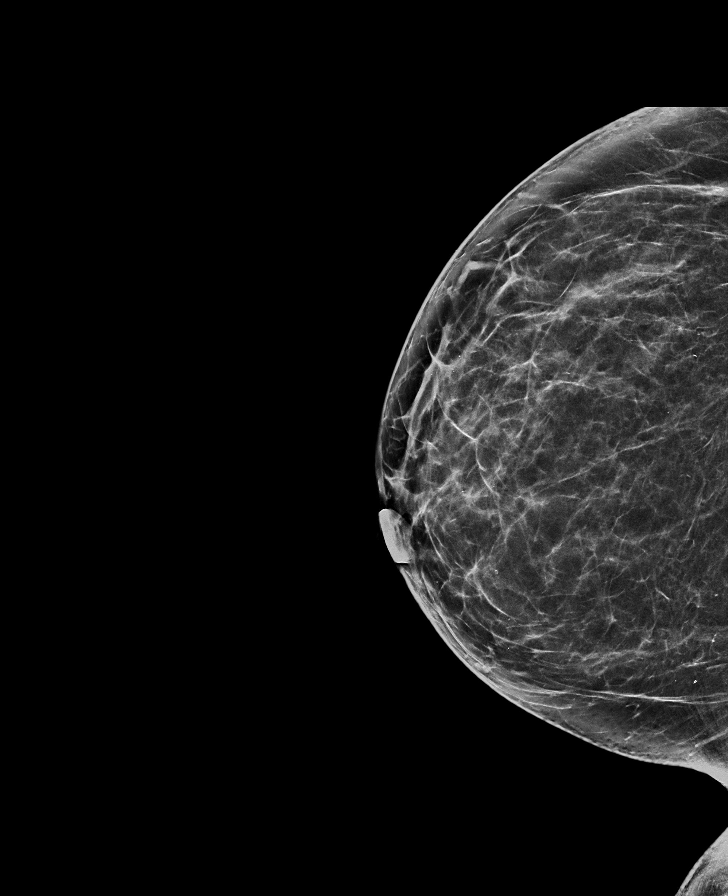

[L CC synth-2D]
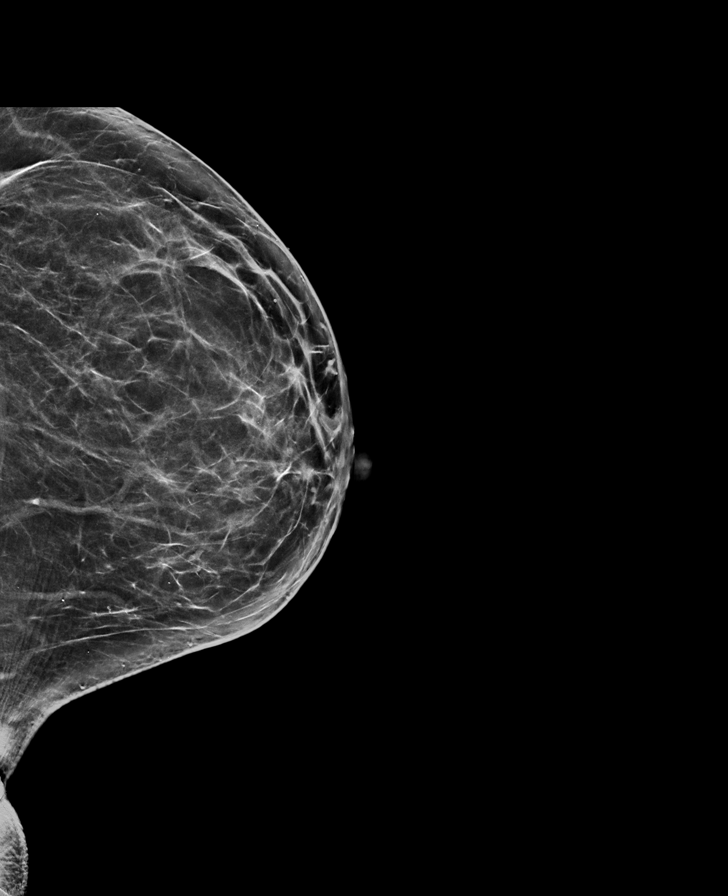

[L MLO tomo · tomo slice 39/78.0]
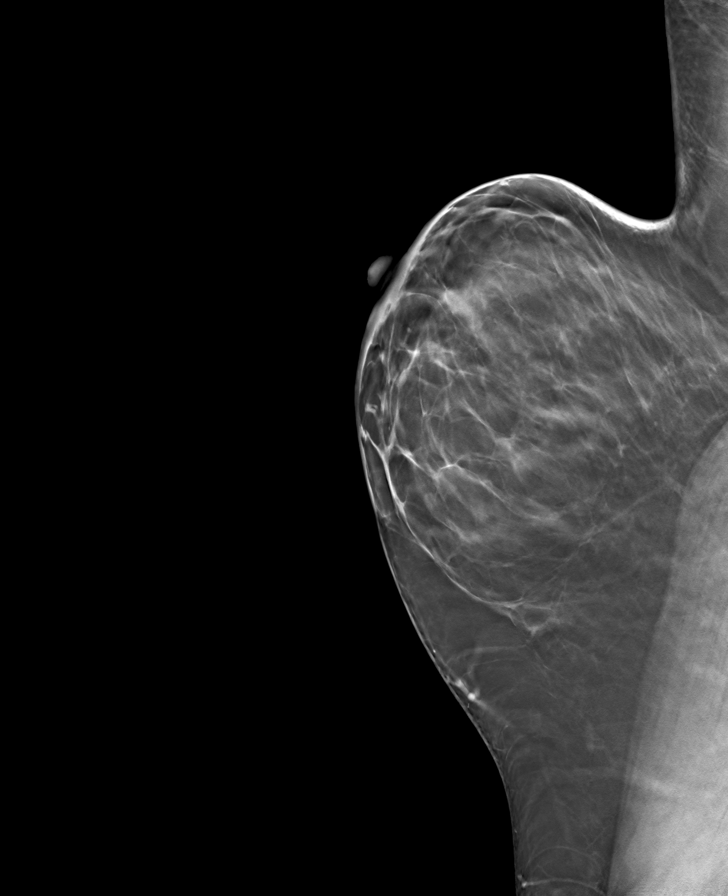

[R MLO tomo · tomo slice 39/77.0]
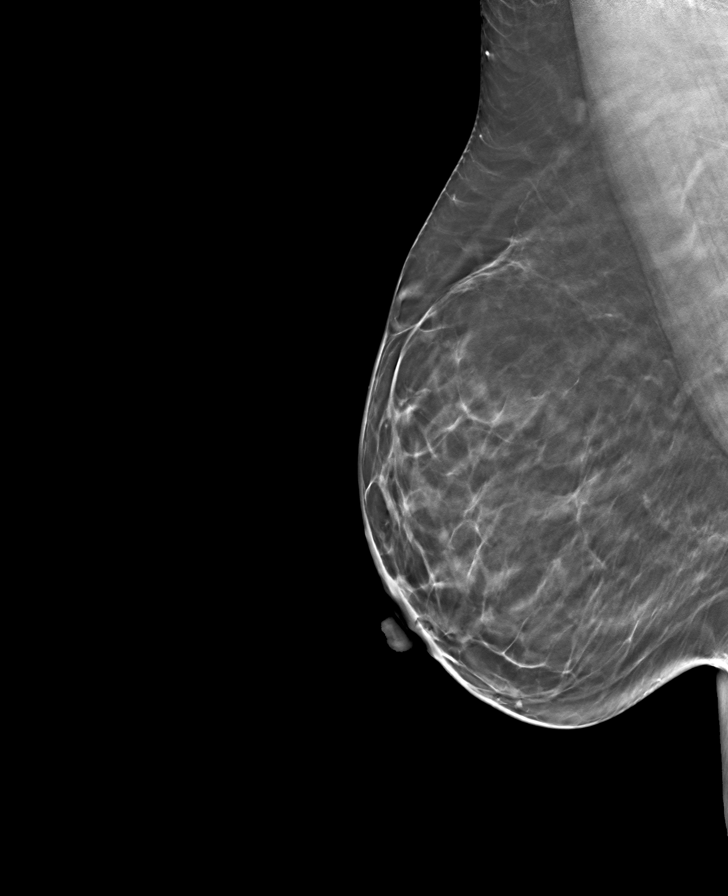

[L CC tomo · tomo slice 37/72.0]
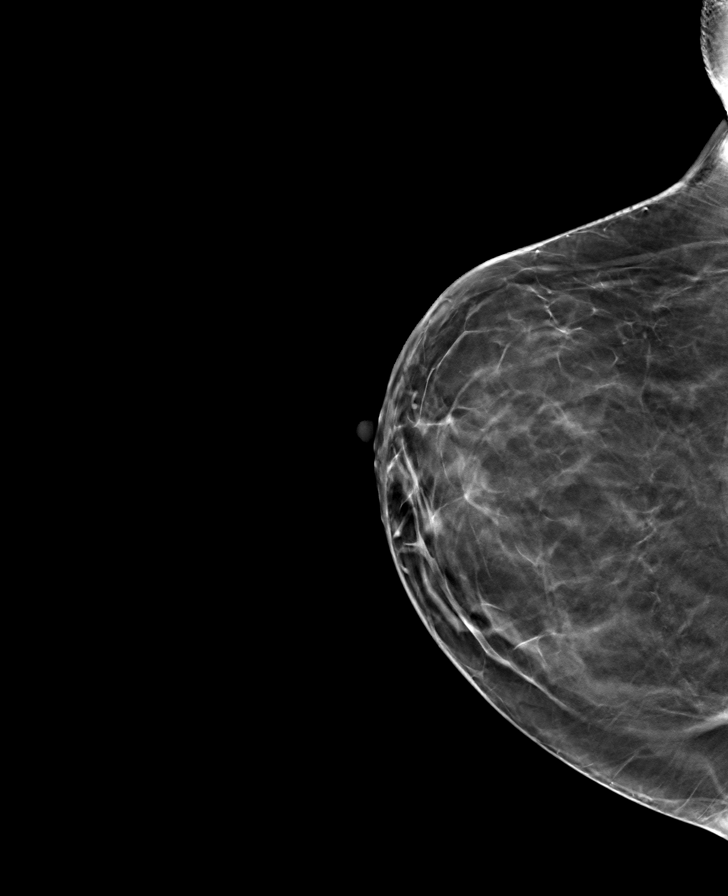

[R CC tomo · tomo slice 35/68.0]
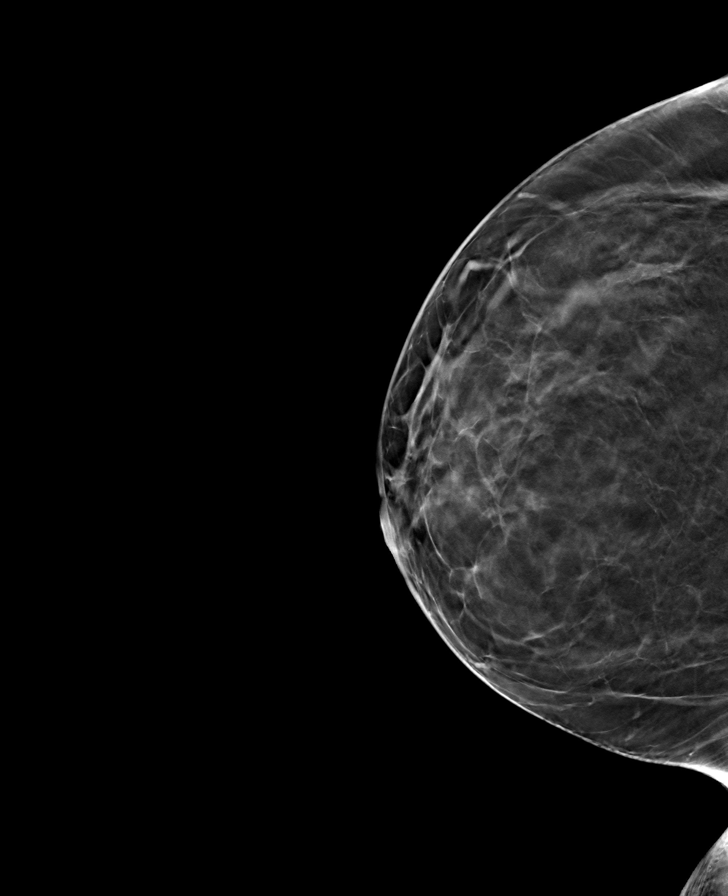

[8 of 24 positions shown; findings below may reference images not displayed]

ACR Breast Density Category b: There are scattered areas of
fibroglandular density.
FINDINGS: There are no findings suspicious for malignancy.
IMPRESSION: No mammographic evidence of malignancy. A result letter of this
screening mammogram will be mailed directly to the patient.

RECOMMENDATION:
Screening mammogram in one year. (Code:51-O-LD2)

BI-RADS CATEGORY  1: Negative.

## 2022-06-01 ENCOUNTER — Telehealth: Payer: 59 | Admitting: Family

## 2022-06-01 DIAGNOSIS — J111 Influenza due to unidentified influenza virus with other respiratory manifestations: Secondary | ICD-10-CM | POA: Diagnosis not present

## 2022-06-01 MED ORDER — OSELTAMIVIR PHOSPHATE 75 MG PO CAPS
75.0000 mg | ORAL_CAPSULE | Freq: Two times a day (BID) | ORAL | 0 refills | Status: DC
  Filled 2022-06-01: qty 10, 5d supply, fill #0

## 2022-06-01 MED ORDER — BENZONATATE 100 MG PO CAPS
100.0000 mg | ORAL_CAPSULE | Freq: Three times a day (TID) | ORAL | 0 refills | Status: DC | PRN
  Filled 2022-06-01: qty 30, 10d supply, fill #0

## 2022-06-01 NOTE — Progress Notes (Signed)
E visit for Flu like symptoms   We are sorry that you are not feeling well.  Here is how we plan to help! Based on what you have shared with me it looks like you may have a respiratory virus that may be influenza.  Influenza or "the flu" is   an infection caused by a respiratory virus. The flu virus is highly contagious and persons who did not receive their yearly flu vaccination may "catch" the flu from close contact.  We have anti-viral medications to treat the viruses that cause this infection. They are not a "cure" and only shorten the course of the infection. These prescriptions are most effective when they are given within the first 2 days of "flu" symptoms. Antiviral medication are indicated if you have a high risk of complications from the flu. You should  also consider an antiviral medication if you are in close contact with someone who is at risk. These medications can help patients avoid complications from the flu  but have side effects that you should know. Possible side effects from Tamiflu or oseltamivir include nausea, vomiting, diarrhea, dizziness, headaches, eye redness, sleep problems or other respiratory symptoms. You should not take Tamiflu if you have an allergy to oseltamivir or any to the ingredients in Tamiflu.  Based upon your symptoms and potential risk factors I have prescribed Oseltamivir (Tamiflu).  It has been sent to your designated pharmacy.  You will take one 75 mg capsule orally twice a day for the next 5 days.  We will also call in prescription for cough: Meds ordered this encounter  Medications   oseltamivir (TAMIFLU) 75 MG capsule    Sig: Take 1 capsule (75 mg total) by mouth 2 (two) times daily.    Dispense:  10 capsule    Refill:  0    Order Specific Question:   Supervising Provider    Answer:   Chase Picket [[id number]]   benzonatate (TESSALON) 100 MG capsule    Sig: Take 1 capsule (100 mg total) by mouth 3 (three) times daily as needed.    Dispense:   30 capsule    Refill:  0     ANYONE WHO HAS FLU SYMPTOMS SHOULD: Stay home. The flu is highly contagious and going out or to work exposes others! Be sure to drink plenty of fluids. Water is fine as well as fruit juices, sodas and electrolyte beverages. You may want to stay away from caffeine or alcohol. If you are nauseated, try taking small sips of liquids. How do you know if you are getting enough fluid? Your urine should be a pale yellow or almost colorless. Get rest. Taking a steamy shower or using a humidifier may help nasal congestion and ease sore throat pain. Using a saline nasal spray works much the same way. Cough drops, hard candies and sore throat lozenges may ease your cough. Line up a caregiver. Have someone check on you regularly.   GET HELP RIGHT AWAY IF: You cannot keep down liquids or your medications. You become short of breath Your fell like you are going to pass out or loose consciousness. Your symptoms persist after you have completed your treatment plan MAKE SURE YOU  Understand these instructions. Will watch your condition. Will get help right away if you are not doing well or get worse.  Your e-visit answers were reviewed by a board certified advanced clinical practitioner to complete your personal care plan.  Depending on the condition, your plan could  have included both over the counter or prescription medications.  If there is a problem please reply  once you have received a response from your provider.  Your safety is important to [place].  If you have drug allergies check your prescription carefully.    You can use MyChart to ask questions about today's visit, request a non-urgent call back, or ask for a work or school excuse for 24 hours related to this e-Visit. If it has been greater than 24 hours you will need to follow up with your provider, or enter a new e-Visit to address those concerns.  You will get an e-mail in the next two days asking about your  experience.  I hope that your e-visit has been valuable and will speed your recovery. Thank you for using e-visits.   I spent approximately 7 minutes reviewing the patient's history, current symptoms and coordinating their plan of care today.

## 2022-06-02 ENCOUNTER — Other Ambulatory Visit: Payer: Self-pay

## 2022-06-12 ENCOUNTER — Telehealth: Payer: 59 | Admitting: Family Medicine

## 2022-06-12 DIAGNOSIS — K130 Diseases of lips: Secondary | ICD-10-CM

## 2022-06-13 ENCOUNTER — Other Ambulatory Visit: Payer: Self-pay

## 2022-06-13 MED ORDER — NYSTATIN-TRIAMCINOLONE 100000-0.1 UNIT/GM-% EX OINT
1.0000 | TOPICAL_OINTMENT | Freq: Two times a day (BID) | CUTANEOUS | 0 refills | Status: DC
  Filled 2022-06-13: qty 30, 15d supply, fill #0

## 2022-06-13 NOTE — Progress Notes (Signed)
E Visit for Rash  We are sorry that you are not feeling well. Here is how we plan to help!  The type of rash you are describing sounds like angular cheiltis or possible cold sores since you have been sick. It causes fissure cracks at the corners of the mouth.  Can be related to yeast and other causes.   I will order nystatin to see if this will improve the area. If it does not help- please be seen in person.  HOME CARE: Applying ice or a cool compress to the corners of your mouth. Avoiding skin irritants like harsh toothpastes, mouthwashes and spicy foods. Staying out of the sun and extreme cold or wind. Using ointments or lip balm to keep the corners of your mouth moisturized.  Avoiding contact with skin allergens and irritants. Eating a healthy diet and drinking plenty of water. Keeping your lips moisturized. Not smoking or using tobacco products. Not licking your lips. Not using expired cosmetics.  GET HELP RIGHT AWAY IF:  Symptoms don't go away after treatment. Severe itching that persists. If you rash spreads or swells. If you rash begins to smell. If it blisters and opens or develops a yellow-brown crust. You develop a fever. You have a sore throat. You become short of breath.  MAKE SURE YOU:  Understand these instructions. Will watch your condition. Will get help right away if you are not doing well or get worse.  Thank you for choosing an e-visit.  Your e-visit answers were reviewed by a board certified advanced clinical practitioner to complete your personal care plan. Depending upon the condition, your plan could have included both over the counter or prescription medications.  Please review your pharmacy choice. Make sure the pharmacy is open so you can pick up prescription now. If there is a problem, you may contact your provider through [employer] and have the prescription routed to another pharmacy.  Your safety is important to [place]. If you have drug  allergies check your prescription carefully.   For the next 24 hours you can use MyChart to ask questions about today's visit, request a non-urgent call back, or ask for a work or school excuse. You will get an email in the next two days asking about your experience. I hope that your e-visit has been valuable and will speed your recovery.    I provided 5 minutes of non face-to-face time during this encounter for chart review, medication and order placement, as well as and documentation.

## 2022-07-20 DIAGNOSIS — M7702 Medial epicondylitis, left elbow: Secondary | ICD-10-CM | POA: Diagnosis not present

## 2022-07-29 ENCOUNTER — Other Ambulatory Visit: Payer: Self-pay

## 2022-10-13 ENCOUNTER — Encounter: Payer: Self-pay | Admitting: Obstetrics and Gynecology

## 2022-10-14 ENCOUNTER — Other Ambulatory Visit: Payer: Self-pay

## 2022-10-14 MED ORDER — NALTREXONE-BUPROPION HCL ER 8-90 MG PO TB12
ORAL_TABLET | ORAL | 6 refills | Status: DC
  Filled 2022-10-14: qty 120, 30d supply, fill #0
  Filled 2022-10-19: qty 120, 47d supply, fill #0
  Filled 2022-10-26 (×2): qty 78, 30d supply, fill #0
  Filled 2022-12-20: qty 78, 30d supply, fill #1

## 2022-10-17 ENCOUNTER — Telehealth: Payer: Self-pay

## 2022-10-19 ENCOUNTER — Other Ambulatory Visit: Payer: Self-pay

## 2022-10-25 NOTE — Telephone Encounter (Signed)
Form has been completed. Can't fax form due to fax machine not working.

## 2022-10-26 ENCOUNTER — Encounter: Payer: Self-pay | Admitting: Obstetrics and Gynecology

## 2022-10-26 ENCOUNTER — Other Ambulatory Visit: Payer: Self-pay

## 2022-10-26 NOTE — Telephone Encounter (Signed)
Pharmacy aware. Rx processed. New price $24.

## 2022-10-26 NOTE — Telephone Encounter (Signed)
Contrave has been approved.

## 2022-10-27 ENCOUNTER — Ambulatory Visit: Payer: 59 | Admitting: Certified Nurse Midwife

## 2022-10-31 ENCOUNTER — Other Ambulatory Visit: Payer: Self-pay | Admitting: Obstetrics and Gynecology

## 2022-10-31 DIAGNOSIS — N644 Mastodynia: Secondary | ICD-10-CM

## 2022-11-02 ENCOUNTER — Ambulatory Visit
Admission: RE | Admit: 2022-11-02 | Discharge: 2022-11-02 | Disposition: A | Discharge status: Home / Self Care | Payer: 59 | Source: Ambulatory Visit | Attending: Obstetrics and Gynecology | Admitting: Obstetrics and Gynecology

## 2022-11-02 ENCOUNTER — Other Ambulatory Visit: Payer: Self-pay

## 2022-11-02 DIAGNOSIS — Z7689 Persons encountering health services in other specified circumstances: Secondary | ICD-10-CM | POA: Diagnosis not present

## 2022-11-02 DIAGNOSIS — N644 Mastodynia: Secondary | ICD-10-CM

## 2022-11-02 DIAGNOSIS — R92323 Mammographic fibroglandular density, bilateral breasts: Secondary | ICD-10-CM | POA: Diagnosis not present

## 2022-11-09 ENCOUNTER — Other Ambulatory Visit: Payer: Self-pay

## 2022-11-09 MED ORDER — ONDANSETRON 4 MG PO TBDP
4.0000 mg | ORAL_TABLET | Freq: Four times a day (QID) | ORAL | 0 refills | Status: DC
  Filled 2022-11-09: qty 10, 3d supply, fill #0

## 2022-11-09 MED ORDER — OXYCODONE HCL 5 MG PO TABS
5.0000 mg | ORAL_TABLET | ORAL | 0 refills | Status: DC | PRN
  Filled 2022-11-09: qty 20, 4d supply, fill #0

## 2022-11-09 MED ORDER — CEPHALEXIN 500 MG PO CAPS
500.0000 mg | ORAL_CAPSULE | Freq: Four times a day (QID) | ORAL | 0 refills | Status: DC
  Filled 2022-11-09: qty 20, 5d supply, fill #0

## 2022-11-13 ENCOUNTER — Other Ambulatory Visit: Payer: Self-pay

## 2022-11-13 MED ORDER — OXYCODONE HCL 5 MG PO TABS
ORAL_TABLET | ORAL | 0 refills | Status: DC
  Filled 2022-11-13: qty 20, 5d supply, fill #0

## 2022-11-14 ENCOUNTER — Other Ambulatory Visit: Payer: Self-pay

## 2022-12-19 NOTE — Progress Notes (Unsigned)
ANNUAL PREVENTATIVE CARE GYNECOLOGY  ENCOUNTER NOTE  Subjective:       Lauren Anthony is a 52 y.o. [phone] female here for a routine annual gynecologic exam. The patient {is/is not/has never been:13135} sexually active. The patient {is/is not:13135} taking hormone replacement therapy. {post-men bleed:13152::"Patient denies post-menopausal vaginal bleeding."} The patient wears seatbelts: {yes/no:311178}. The patient participates in regular exercise: {yes/no/not asked:9010}. Has the patient ever been transfused or tattooed?: {yes/no/not asked:9010}. The patient reports that there {is/is not:9024} domestic violence in her life.  Current complaints: 1.  ***    Gynecologic History Patient's last menstrual period was 12/14/2014. Contraception: status post hysterectomy Last Pap:   Laparoscopic hysterectomy (2016)  Last mammogram: 11/02/2022. Results were: normal Last Colonoscopy: Never done Last Dexa Scan: Never done   Obstetric History OB History  Gravida Para Term Preterm AB Living  3 3 1 2   3   SAB IAB Ectopic Multiple Live Births          3    # Outcome Date GA Lbr Len/2nd Weight Sex Type Anes PTL Lv  3 Preterm 11/21/99 [redacted]w[redacted]d   F CS-Unspec   LIV  2 Preterm 01/03/93 [redacted]w[redacted]d   M VBAC  Y LIV  1 Term 08/12/87    F CS-Unspec   LIV    Past Medical History:  Diagnosis Date   Cholelithiases 2016   Heavy periods     Family History  Problem Relation Age of Onset   Lung cancer Mother    Cancer Neg Hx    Heart disease Neg Hx    Diabetes Neg Hx    Breast cancer Neg Hx     Past Surgical History:  Procedure Laterality Date   BILATERAL SALPINGECTOMY Bilateral 12/22/2014   Procedure: BILATERAL SALPINGECTOMY;  Surgeon: Hildred Laser, MD;  Location: [facility];  Service: Gynecology;  Laterality: Bilateral;   CESAREAN SECTION     CHOLECYSTECTOMY     CYSTOSCOPY  12/22/2014   Procedure: CYSTOSCOPY;  Surgeon: Hildred Laser, MD;  Location: [facility];  Service: Gynecology;;   ENDOMETRIAL  ABLATION     LAPAROSCOPIC GASTRIC SLEEVE RESECTION     LAPAROSCOPIC HYSTERECTOMY  12/22/2014   Procedure: HYSTERECTOMY TOTAL LAPAROSCOPIC;  Surgeon: Hildred Laser, MD;  Location: [facility];  Service: Gynecology;;   Cammy Copa ABLATION  06/2014    Social History   Socioeconomic History   Marital status: Married    Spouse name: Not on file   Number of children: Not on file   Years of education: Not on file   Highest education level: Not on file  Occupational History   Occupation: CLERICAL    Employer: [employer]  Tobacco Use   Smoking status: Never   Smokeless tobacco: Never  Vaping Use   Vaping status: Never Used  Substance and Sexual Activity   Alcohol use: Not Currently    Alcohol/week: 0.0 standard drinks of alcohol    Comment: rare   Drug use: No   Sexual activity: Yes    Partners: Male    Birth control/protection: Surgical  Other Topics Concern   Not on file  Social History Narrative   Not on file   Social Determinants of Health   Financial Resource Strain: Not on file  Food Insecurity: Not on file  Transportation Needs: Not on file  Physical Activity: Not on file  Stress: Not on file  Social Connections: Not on file  Intimate Partner Violence: Not on file    Current Outpatient Medications on File Prior to  Visit  Medication Sig Dispense Refill   benzonatate (TESSALON) 100 MG capsule Take 1 capsule (100 mg total) by mouth 3 (three) times daily as needed. 30 capsule 0   cephALEXin (KEFLEX) 500 MG capsule Take 1 capsule (500 mg total) by mouth 4 (four) times daily as directed for 5 days. 20 capsule 0   ciprofloxacin (CIPRO) 500 MG tablet Take 1 tablet (500 mg total) by mouth 2 (two) times daily. 6 tablet 0   Naltrexone-buPROPion HCl ER 8-90 MG TB12 Start 1 tablet every morning for 7 days, then 1 tablet twice daily for 7 days, then 2 tablets every morning and one in the evening 120 tablet 6   nystatin-triamcinolone ointment (MYCOLOG) Apply 1 Application topically 2 (two)  times daily. Avoid getting in the mouth- apply only a dab to the area 30 g 0   omeprazole (PRILOSEC) 20 MG capsule Take 1 capsule (20 mg total) by mouth daily. 30 capsule 0   ondansetron (ZOFRAN-ODT) 4 MG disintegrating tablet Take 1 tablet (4 mg total) by mouth every 6 (six) to 8 (eight)hours as directed. 10 tablet 0   oseltamivir (TAMIFLU) 75 MG capsule Take 1 capsule (75 mg total) by mouth 2 (two) times daily. 10 capsule 0   oxyCODONE (OXY IR/ROXICODONE) 5 MG immediate release tablet Take 1 tablet (5 mg total) by mouth every 4 (four) hours as needed for 5 days. 20 tablet 0   oxyCODONE (OXY IR/ROXICODONE) 5 MG immediate release tablet Take 1 tablet every 4 hours by oral route as needed for 5 days. 20 tablet 0   No current facility-administered medications on file prior to visit.    No Known Allergies    Review of Systems ROS Review of Systems - General ROS: negative for - chills, fatigue, fever, hot flashes, night sweats, weight gain or weight loss Psychological ROS: negative for - anxiety, decreased libido, depression, mood swings, physical abuse or sexual abuse Ophthalmic ROS: negative for - blurry vision, eye pain or loss of vision ENT ROS: negative for - headaches, hearing change, visual changes or vocal changes Allergy and Immunology ROS: negative for - hives, itchy/watery eyes or seasonal allergies Hematological and Lymphatic ROS: negative for - bleeding problems, bruising, swollen lymph nodes or weight loss Endocrine ROS: negative for - galactorrhea, hair pattern changes, hot flashes, malaise/lethargy, mood swings, palpitations, polydipsia/polyuria, skin changes, temperature intolerance or unexpected weight changes Breast ROS: negative for - new or changing breast lumps or nipple discharge Respiratory ROS: negative for - cough or shortness of breath Cardiovascular ROS: negative for - chest pain, irregular heartbeat, palpitations or shortness of breath Gastrointestinal ROS: no  abdominal pain, change in bowel habits, or black or bloody stools Genito-Urinary ROS: no dysuria, trouble voiding, or hematuria Musculoskeletal ROS: negative for - joint pain or joint stiffness Neurological ROS: negative for - bowel and bladder control changes Dermatological ROS: negative for rash and skin lesion changes   Objective:   LMP 12/14/2014  CONSTITUTIONAL: Well-developed, well-nourished female in no acute distress.  PSYCHIATRIC: Normal mood and affect. Normal behavior. Normal judgment and thought content. NEUROLGIC: Alert and oriented to person, place, and time. Normal muscle tone coordination. No cranial nerve deficit noted. HENT:  Normocephalic, atraumatic, External right and left ear normal. Oropharynx is clear and moist EYES: Conjunctivae and EOM are normal. Pupils are equal, round, and reactive to light. No scleral icterus.  NECK: Normal range of motion, supple, no masses.  Normal thyroid.  SKIN: Skin is warm and dry. No rash noted.  Not diaphoretic. No erythema. No pallor. CARDIOVASCULAR: Normal heart rate noted, regular rhythm, no murmur. RESPIRATORY: Clear to auscultation bilaterally. Effort and breath sounds normal, no problems with respiration noted. BREASTS: Symmetric in size. No masses, skin changes, nipple drainage, or lymphadenopathy. ABDOMEN: Soft, normal bowel sounds, no distention noted.  No tenderness, rebound or guarding.  BLADDER: Normal PELVIC:  Bladder {:311640}  Urethra: {:311719}  Vulva: {:311722}  Vagina: {:311643}  Cervix: {:311644}  Uterus: {:311718}  Adnexa: {:311645}  RV: {Blank multiple:19196::"External Exam NormaI","No Rectal Masses","Normal Sphincter tone"}  MUSCULOSKELETAL: Normal range of motion. No tenderness.  No cyanosis, clubbing, or edema.  2+ distal pulses. LYMPHATIC: No Axillary, Supraclavicular, or Inguinal Adenopathy.   Labs: Lab Results  Component Value Date   WBC 5.1 01/05/2022   HGB 14.2 01/05/2022   HCT 41.7 01/05/2022    MCV 88 01/05/2022   PLT 266 01/05/2022    Lab Results  Component Value Date   CREATININE 0.83 01/05/2022   BUN 10 01/05/2022   NA 141 01/05/2022   K 4.1 01/05/2022   CL 106 01/05/2022   CO2 21 01/05/2022    Lab Results  Component Value Date   ALT 16 01/05/2022   AST 21 01/05/2022   ALKPHOS 62 01/05/2022   BILITOT 0.6 01/05/2022    Lab Results  Component Value Date   CHOL 175 01/05/2022   HDL 50 01/05/2022   LDLCALC 109 (H) 01/05/2022   TRIG 87 01/05/2022   CHOLHDL 3.5 01/05/2022    Lab Results  Component Value Date   TSH 2.170 01/05/2022    Lab Results  Component Value Date   HGBA1C 5.1 01/05/2022     Assessment:   1. Encounter for well woman exam with routine gynecological exam   2. Screening for diabetes mellitus (DM)   3. Screening cholesterol level      Plan:  Pap: Not needed Mammogram:  UTD Colon Screening:  Ordered Labs:  Ordered today Routine preventative health maintenance measures emphasized:  Self Breast Exam and Exercise/Diet/Weight control COVID Vaccination status: Return to Clinic - 1 Year   Hildred Laser, MD [facility]

## 2022-12-19 NOTE — Patient Instructions (Incomplete)
Preventive Care 40-52 Years Old, Female Preventive care refers to lifestyle choices and visits with your health care provider that can promote health and wellness. Preventive care visits are also called wellness exams. What can I expect for my preventive care visit? Counseling Your health care provider may ask you questions about your: Medical history, including: Past medical problems. Family medical history. Pregnancy history. Current health, including: Menstrual cycle. Method of birth control. Emotional well-being. Home life and relationship well-being. Sexual activity and sexual health. Lifestyle, including: Alcohol, nicotine or tobacco, and drug use. Access to firearms. Diet, exercise, and sleep habits. Work and work environment. Sunscreen use. Safety issues such as seatbelt and bike helmet use. Physical exam Your health care provider will check your: Height and weight. These may be used to calculate your BMI (body mass index). BMI is a measurement that tells if you are at a healthy weight. Waist circumference. This measures the distance around your waistline. This measurement also tells if you are at a healthy weight and may help predict your risk of certain diseases, such as type 2 diabetes and high blood pressure. Heart rate and blood pressure. Body temperature. Skin for abnormal spots. What immunizations do I need?  Vaccines are usually given at various ages, according to a schedule. Your health care provider will recommend vaccines for you based on your age, medical history, and lifestyle or other factors, such as travel or where you work. What tests do I need? Screening Your health care provider may recommend screening tests for certain conditions. This may include: Lipid and cholesterol levels. Diabetes screening. This is done by checking your blood sugar (glucose) after you have not eaten for a while (fasting). Pelvic exam and Pap test. Hepatitis B test. Hepatitis C  test. HIV (human immunodeficiency virus) test. STI (sexually transmitted infection) testing, if you are at risk. Lung cancer screening. Colorectal cancer screening. Mammogram. Talk with your health care provider about when you should start having regular mammograms. This may depend on whether you have a family history of breast cancer. BRCA-related cancer screening. This may be done if you have a family history of breast, ovarian, tubal, or peritoneal cancers. Bone density scan. This is done to screen for osteoporosis. Talk with your health care provider about your test results, treatment options, and if necessary, the need for more tests. Follow these instructions at home: Eating and drinking  Eat a diet that includes fresh fruits and vegetables, whole grains, lean protein, and low-fat dairy products. Take vitamin and mineral supplements as recommended by your health care provider. Do not drink alcohol if: Your health care provider tells you not to drink. You are pregnant, may be pregnant, or are planning to become pregnant. If you drink alcohol: Limit how much you have to 0-1 drink a day. Know how much alcohol is in your drink. In the [place] one drink equals one 12 oz bottle of beer (355 mL), one 5 oz glass of wine (148 mL), or one 1 oz glass of hard liquor (44 mL). Lifestyle Brush your teeth every morning and night with fluoride toothpaste. Floss one time each day. Exercise for at least 30 minutes 5 or more days each week. Do not use any products that contain nicotine or tobacco. These products include cigarettes, chewing tobacco, and vaping devices, such as e-cigarettes. If you need help quitting, ask your health care provider. Do not use drugs. If you are sexually active, practice safe sex. Use a condom or other form of protection to   prevent STIs. If you do not wish to become pregnant, use a form of birth control. If you plan to become pregnant, see your health care provider for a  prepregnancy visit. Take aspirin only as told by your health care provider. Make sure that you understand how much to take and what form to take. Work with your health care provider to find out whether it is safe and beneficial for you to take aspirin daily. Find healthy ways to manage stress, such as: Meditation, yoga, or listening to music. Journaling. Talking to a trusted person. Spending time with friends and family. Minimize exposure to UV radiation to reduce your risk of skin cancer. Safety Always wear your seat belt while driving or riding in a vehicle. Do not drive: If you have been drinking alcohol. Do not ride with someone who has been drinking. When you are tired or distracted. While texting. If you have been using any mind-altering substances or drugs. Wear a helmet and other protective equipment during sports activities. If you have firearms in your house, make sure you follow all gun safety procedures. Seek help if you have been physically or sexually abused. What's next? Visit your health care provider once a year for an annual wellness visit. Ask your health care provider how often you should have your eyes and teeth checked. Stay up to date on all vaccines. This information is not intended to replace advice given to you by your health care provider. Make sure you discuss any questions you have with your health care provider. Document Revised: 10/21/2020 Document Reviewed: 10/21/2020 Elsevier Patient Education  2024 Elsevier Inc. Breast Self-Awareness Breast self-awareness is knowing how your breasts look and feel. You need to: Check your breasts on a regular basis. Tell your doctor about any changes. Become familiar with the look and feel of your breasts. This can help you catch a breast problem while it is still small and can be treated. You should do breast self-exams even if you have breast implants. What you need: A mirror. A well-lit room. A pillow or other  soft object. How to do a breast self-exam Follow these steps to do a breast self-exam: Look for changes  Take off all the clothes above your waist. Stand in front of a mirror in a room with good lighting. Put your hands down at your sides. Compare your breasts in the mirror. Look for any difference between them, such as: A difference in shape. A difference in size. Wrinkles, dips, and bumps in one breast and not the other. Look at each breast for changes in the skin, such as: Redness. Scaly areas. Skin that has gotten thicker. Dimpling. Open sores (ulcers). Look for changes in your nipples, such as: Fluid coming out of a nipple. Fluid around a nipple. Bleeding. Dimpling. Redness. A nipple that looks pushed in (retracted), or that has changed position. Feel for changes Lie on your back. Feel each breast. To do this: Pick a breast to feel. Place a pillow under the shoulder closest to that breast. Put the arm closest to that breast behind your head. Feel the nipple area of that breast using the hand of your other arm. Feel the area with the pads of your three middle fingers by making small circles with your fingers. Use light, medium, and firm pressure. Continue the overlapping circles, moving downward over the breast. Keep making circles with your fingers. Stop when you feel your ribs. Start making circles with your fingers again, this time going   upward until you reach your collarbone. Then, make circles outward across your breast and into your armpit area. Squeeze your nipple. Check for discharge and lumps. Repeat these steps to check your other breast. Sit or stand in the tub or shower. With soapy water on your skin, feel each breast the same way you did when you were lying down. Write down what you find Writing down what you find can help you remember what to tell your doctor. Write down: What is normal for each breast. Any changes you find in each breast. These  include: The kind of changes you find. A tender or painful breast. Any lump you find. Write down its size and where it is. When you last had your monthly period (menstrual cycle). General tips If you are breastfeeding, the best time to check your breasts is after you feed your baby or after you use a breast pump. If you get monthly bleeding, the best time to check your breasts is 5-7 days after your monthly cycle ends. With time, you will become comfortable with the self-exam. You will also start to know if there are changes in your breasts. Contact a doctor if: You see a change in the shape or size of your breasts or nipples. You see a change in the skin of your breast or nipples, such as red or scaly skin. You have fluid coming from your nipples that is not normal. You find a new lump or thick area. You have breast pain. You have any concerns about your breast health. Summary Breast self-awareness includes looking for changes in your breasts and feeling for changes within your breasts. You should do breast self-awareness in front of a mirror in a well-lit room. If you get monthly periods (menstrual cycles), the best time to check your breasts is 5-7 days after your period ends. Tell your doctor about any changes you see in your breasts. Changes include changes in size, changes on the skin, painful or tender breasts, or fluid from your nipples that is not normal. This information is not intended to replace advice given to you by your health care provider. Make sure you discuss any questions you have with your health care provider. Document Revised: 09/30/2021 Document Reviewed: 02/25/2021 Elsevier Patient Education  2024 Elsevier Inc.  

## 2022-12-20 ENCOUNTER — Ambulatory Visit (INDEPENDENT_AMBULATORY_CARE_PROVIDER_SITE_OTHER): Payer: 59 | Admitting: Obstetrics and Gynecology

## 2022-12-20 ENCOUNTER — Other Ambulatory Visit: Payer: Self-pay

## 2022-12-20 VITALS — BP 125/66 | HR 61 | Resp 16 | Ht 62.0 in | Wt 148.2 lb

## 2022-12-20 DIAGNOSIS — Z131 Encounter for screening for diabetes mellitus: Secondary | ICD-10-CM | POA: Diagnosis not present

## 2022-12-20 DIAGNOSIS — E663 Overweight: Secondary | ICD-10-CM

## 2022-12-20 DIAGNOSIS — Z01419 Encounter for gynecological examination (general) (routine) without abnormal findings: Secondary | ICD-10-CM

## 2022-12-20 DIAGNOSIS — Z13220 Encounter for screening for lipoid disorders: Secondary | ICD-10-CM | POA: Diagnosis not present

## 2022-12-23 ENCOUNTER — Other Ambulatory Visit: Payer: Self-pay

## 2022-12-23 ENCOUNTER — Encounter: Payer: Self-pay | Admitting: Obstetrics and Gynecology

## 2022-12-26 MED ORDER — NALTREXONE-BUPROPION HCL ER 8-90 MG PO TB12: ORAL_TABLET | ORAL | Status: DC

## 2022-12-26 NOTE — Progress Notes (Signed)
Done

## 2023-02-02 ENCOUNTER — Other Ambulatory Visit: Payer: Self-pay

## 2023-02-02 ENCOUNTER — Other Ambulatory Visit: Payer: Self-pay | Admitting: Obstetrics and Gynecology

## 2023-02-02 ENCOUNTER — Telehealth: Payer: 59 | Admitting: Physician Assistant

## 2023-02-02 DIAGNOSIS — L7682 Other postprocedural complications of skin and subcutaneous tissue: Secondary | ICD-10-CM

## 2023-02-02 MED ORDER — METHYLPREDNISOLONE 4 MG PO TBPK
ORAL_TABLET | ORAL | 0 refills | Status: DC
  Filled 2023-02-02: qty 21, 6d supply, fill #0

## 2023-02-02 NOTE — Progress Notes (Signed)
Because this is an ongoing issue from surgery a couple of months ago and is above the scope of what we are allowed to treat via an e-visit, I feel your condition warrants further evaluation and I recommend that you be seen in a face to face visit.   NOTE: There will be NO CHARGE for this eVisit   If you are having a true medical emergency please call 911.      For an urgent face to face visit, [place] has eight urgent care centers for your convenience:   NEW!! [facility]gent Care Center at [facility] Get Driving Directions [phone] [address], [address] [place], [address]    [facility] Ur[facility] Center at [facility] Get Driving Directions [phone] [address] [place], [place] [address]   [facility] Health Urgent Care Center [facility]) Get Driving Directions [phone] [address] [place], [place] [address]  [facility] Health Urgent Care Center [facility]) Get Driving Directions [phone] [address] [place],  [place]  [address]  [facility] Health Urgent Care Center [facility] - at [employer]  [phone] [address] [place],  [place] [address]   [facility] Health Urgent Care at [facility] Get Driving Directions [phone] [address] [address] [place], [place] [address]   [facility] Health Urgent Care at [facility] Get Driving Directions  [phone] [address] [place], [place] [address]   [facility] Health Urgent Care at [facility] Directions [phone] [address], [address] [place], [place] [address]  Your MyChart E-visit questionnaire answers were reviewed by a board certified advanced clinical practitioner to complete your personal care plan based on your specific symptoms.  Thank you for using e-Visits.

## 2023-03-01 ENCOUNTER — Other Ambulatory Visit: Payer: Self-pay

## 2023-03-01 MED ORDER — GABAPENTIN 300 MG PO CAPS
300.0000 mg | ORAL_CAPSULE | Freq: Three times a day (TID) | ORAL | 0 refills | Status: DC
  Filled 2023-03-01: qty 63, 21d supply, fill #0

## 2023-03-29 ENCOUNTER — Telehealth: Payer: 59 | Admitting: Physician Assistant

## 2023-03-29 DIAGNOSIS — S51039A Puncture wound without foreign body of unspecified elbow, initial encounter: Secondary | ICD-10-CM

## 2023-03-29 MED ORDER — SULFAMETHOXAZOLE-TRIMETHOPRIM 800-160 MG PO TABS: 1.0000 | ORAL_TABLET | Freq: Two times a day (BID) | ORAL | 0 refills | Status: DC

## 2023-03-29 NOTE — Progress Notes (Signed)
E-Visit for Cellulitis/Abscess  We are sorry that you are not feeling well. Here is how we plan to help!  Based on what you shared with me it looks like you have cellulitis.  Cellulitis looks like areas of skin redness, swelling, and warmth; it develops as a result of bacteria entering under the skin. Little red spots and/or bleeding can be seen in skin, and tiny surface sacs containing fluid can occur. Fever can be present.  I have prescribed:  Bactrim DS 1 tablet by mouth twice a day for 7 days  HOME CARE:  Take your medications as ordered and take all of them, even if the skin irritation appears to be healing.   GET HELP RIGHT AWAY IF:  Symptoms that don't begin to go away within 48 hours. Severe redness persists or worsens If the area turns color, spreads or swells. If it blisters and opens, develops yellow-brown crust or bleeds. You develop a fever or chills. If the pain increases or becomes unbearable.  Are unable to keep fluids and food down.  MAKE SURE YOU   Understand these instructions. Will watch your condition. Will get help right away if you are not doing well or get worse.  Thank you for choosing an e-visit.  Your e-visit answers were reviewed by a board certified advanced clinical practitioner to complete your personal care plan. Depending upon the condition, your plan could have included both over the counter or prescription medications.  Please review your pharmacy choice. Make sure the pharmacy is open so you can pick up prescription now. If there is a problem, you may contact your provider through [employer] and have the prescription routed to another pharmacy.  Your safety is important to [place]. If you have drug allergies check your prescription carefully.   For the next 24 hours you can use MyChart to ask questions about today's visit, request a non-urgent call back, or ask for a work or school excuse. You will get an email in the next two days asking about  your experience. I hope that your e-visit has been valuable and will speed your recovery.   I have spent 5 minutes in review of e-visit questionnaire, review and updating patient chart, medical decision making and response to patient.   Margaretann Loveless, PA-C

## 2023-05-10 ENCOUNTER — Telehealth: Payer: 59 | Admitting: Physician Assistant

## 2023-05-10 DIAGNOSIS — J019 Acute sinusitis, unspecified: Secondary | ICD-10-CM | POA: Diagnosis not present

## 2023-05-10 DIAGNOSIS — B9689 Other specified bacterial agents as the cause of diseases classified elsewhere: Secondary | ICD-10-CM | POA: Diagnosis not present

## 2023-05-10 MED ORDER — AMOXICILLIN-POT CLAVULANATE 875-125 MG PO TABS
1.0000 | ORAL_TABLET | Freq: Two times a day (BID) | ORAL | 0 refills | Status: DC
  Filled 2023-05-10: qty 14, 7d supply, fill #0

## 2023-05-10 NOTE — Progress Notes (Signed)

## 2023-05-11 ENCOUNTER — Other Ambulatory Visit: Payer: Self-pay

## 2023-05-17 ENCOUNTER — Telehealth: Payer: 59 | Admitting: Physician Assistant

## 2023-05-17 DIAGNOSIS — B999 Unspecified infectious disease: Secondary | ICD-10-CM

## 2023-05-17 NOTE — Progress Notes (Signed)
   Thank you for the details you included in the comment boxes. Those details are very helpful in determining the best course of treatment for you and help us  to provide the best care.Because of continued and progressing symptoms despite treatment via e-visit, we recommend that you schedule a Virtual Urgent Care video visit in order for the provider to better assess what is going on.  The provider will be able to give you a more accurate diagnosis and treatment plan if we can more freely discuss your symptoms and with the addition of a virtual examination.   If you change your visit to a video visit, we will bill your insurance (similar to an office visit) and you will not be charged for this e-Visit. You will be able to stay at home and speak with the first available [facility] advanced practice provider. The link to do a video visit is in the drop down Menu tab of your Welcome screen in MyChart.

## 2023-08-16 ENCOUNTER — Other Ambulatory Visit: Payer: Self-pay

## 2023-08-16 MED ORDER — ONDANSETRON 4 MG PO TBDP
4.0000 mg | ORAL_TABLET | Freq: Four times a day (QID) | ORAL | 0 refills | Status: DC | PRN
  Filled 2023-08-16: qty 12, 3d supply, fill #0

## 2023-08-16 MED ORDER — CEPHALEXIN 500 MG PO CAPS
500.0000 mg | ORAL_CAPSULE | Freq: Three times a day (TID) | ORAL | 0 refills | Status: DC
  Filled 2023-08-16: qty 15, 5d supply, fill #0

## 2023-08-18 ENCOUNTER — Other Ambulatory Visit: Payer: Self-pay

## 2023-08-18 MED ORDER — OXYCODONE HCL 5 MG PO TABS
5.0000 mg | ORAL_TABLET | ORAL | 0 refills | Status: DC | PRN
  Filled 2023-08-18: qty 20, 3d supply, fill #0

## 2023-08-22 DIAGNOSIS — Z419 Encounter for procedure for purposes other than remedying health state, unspecified: Secondary | ICD-10-CM | POA: Diagnosis not present

## 2023-12-29 ENCOUNTER — Encounter: Payer: Self-pay | Admitting: Internal Medicine

## 2023-12-29 ENCOUNTER — Ambulatory Visit: Admitting: Internal Medicine

## 2023-12-29 VITALS — BP 114/74 | HR 87 | Ht 62.0 in | Wt 149.5 lb

## 2023-12-29 DIAGNOSIS — Z1211 Encounter for screening for malignant neoplasm of colon: Secondary | ICD-10-CM

## 2023-12-29 DIAGNOSIS — Z136 Encounter for screening for cardiovascular disorders: Secondary | ICD-10-CM | POA: Diagnosis not present

## 2023-12-29 DIAGNOSIS — R350 Frequency of micturition: Secondary | ICD-10-CM

## 2023-12-29 DIAGNOSIS — Z1231 Encounter for screening mammogram for malignant neoplasm of breast: Secondary | ICD-10-CM

## 2023-12-29 DIAGNOSIS — E663 Overweight: Secondary | ICD-10-CM | POA: Diagnosis not present

## 2023-12-29 DIAGNOSIS — M545 Low back pain, unspecified: Secondary | ICD-10-CM

## 2023-12-29 DIAGNOSIS — R739 Hyperglycemia, unspecified: Secondary | ICD-10-CM | POA: Diagnosis not present

## 2023-12-29 DIAGNOSIS — Z0001 Encounter for general adult medical examination with abnormal findings: Secondary | ICD-10-CM

## 2023-12-29 DIAGNOSIS — Z6827 Body mass index (BMI) 27.0-27.9, adult: Secondary | ICD-10-CM | POA: Insufficient documentation

## 2023-12-29 LAB — POCT URINALYSIS DIPSTICK
Bilirubin, UA: NEGATIVE
Blood, UA: NEGATIVE
Glucose, UA: NEGATIVE
Ketones, UA: NEGATIVE
Leukocytes, UA: NEGATIVE
Nitrite, UA: NEGATIVE
Protein, UA: NEGATIVE
Spec Grav, UA: 1.01 (ref 1.010–1.025)
Urobilinogen, UA: 0.2 U/dL
pH, UA: 6 (ref 5.0–8.0)

## 2023-12-29 NOTE — Patient Instructions (Signed)

## 2023-12-29 NOTE — Progress Notes (Signed)
 Subjective:    Patient ID: Lauren Anthony, female    DOB: May 30, 1970, 53 y.o.   MRN: [id number]  HPI  Patient presents to clinic today to establish care and for management of the conditions listed below.  She would like her annual exam today.  Anxiety: Situational.  She is not currently taking any medications for this.  She is not currently seeing a therapist.  She denies depression, SI/HI.  HLD: Her last LDL was 113, triglycerides 89, 12/2022.  She is not taking any cholesterol-lowering medication at this time.  She tries to consume a low-fat diet.  Flu: 02/2023 Tetanus: > 10 years ago COVID: x 2 Shingrix: never Pap smear: Hysterectomy Mammogram: 10/2022 Colon screening: 12/2020, Cologuard Vision screening: as needed Dentist: biannually  Diet: She does eat meat. She consumes fruits and veggies. She does eat some fried foods. She drinks mostly water, energy drink Exercise: Gym 2-3 x week  Review of Systems   Past Medical History:  Diagnosis Date   Cholelithiases 2016   Heavy periods     Current Outpatient Medications  Medication Sig Dispense Refill   amoxicillin -clavulanate (AUGMENTIN ) 875-125 MG tablet Take 1 tablet by mouth 2 (two) times daily. 14 tablet 0   cephALEXin  (KEFLEX ) 500 MG capsule Take 1 capsule (500 mg total) by mouth 3 (three) times daily for 5 days as directed 15 capsule 0   ondansetron  (ZOFRAN -ODT) 4 MG disintegrating tablet Dissolve 1 tablet (4 mg total) on the tongue every 6 (six) hours as needed for nausea for 3 days 12 tablet 0   oxyCODONE  (OXY IR/ROXICODONE ) 5 MG immediate release tablet Take 1 tablet (5 mg total) by mouth every 4 (four) hours as needed for 5 days. 20 tablet 0   No current facility-administered medications for this visit.    No Known Allergies  Family History  Problem Relation Age of Onset   Lung cancer Mother    Cancer Neg Hx    Heart disease Neg Hx    Diabetes Neg Hx    Breast cancer Neg Hx     Social History    Socioeconomic History   Marital status: Married    Spouse name: Not on file   Number of children: Not on file   Years of education: Not on file   Highest education level: Not on file  Occupational History   Occupation: CLERICAL    Employer: [employer]  Tobacco Use   Smoking status: Never   Smokeless tobacco: Never  Vaping Use   Vaping status: Never Used  Substance and Sexual Activity   Alcohol use: Not Currently    Alcohol/week: 0.0 standard drinks of alcohol    Comment: rare   Drug use: No   Sexual activity: Yes    Partners: Male    Birth control/protection: Surgical  Other Topics Concern   Not on file  Social History Narrative   Not on file   Social Drivers of Health   Financial Resource Strain: Not on file  Food Insecurity: Not on file  Transportation Needs: Not on file  Physical Activity: Not on file  Stress: Not on file  Social Connections: Not on file  Intimate Partner Violence: Not on file     Constitutional: Denies fever, malaise, fatigue, headache or abrupt weight changes.  HEENT: Denies eye pain, eye redness, ear pain, ringing in the ears, wax buildup, runny nose, nasal congestion, bloody nose, or sore throat. Respiratory: Denies difficulty breathing, shortness of breath, cough or sputum production.  Cardiovascular: Denies chest pain, chest tightness, palpitations or swelling in the hands or feet.  Gastrointestinal: Denies abdominal pain, bloating, constipation, diarrhea or blood in the stool.  GU: Pt reports urinary frequency. Denies urgency,  pain with urination, burning sensation, blood in urine, odor or discharge. Musculoskeletal: Pt reports low back pain. Denies decrease in range of motion, difficulty with gait, muscle pain or joint  swelling.  Skin: Denies redness, rashes, lesions or ulcercations.  Neurological: Denies dizziness, difficulty with memory, difficulty with speech or problems with balance and coordination.  Psych: Patient has a history of  anxiety.  Denies depression, SI/HI.  No other specific complaints in a complete review of systems (except as listed in HPI above).      Objective:   Physical Exam  BP 114/74 (BP Location: Right Arm, Patient Position: Sitting, Cuff Size: Normal)   Pulse 87   Ht 5' 2 (1.575 m)   Wt 149 lb 8 oz (67.8 kg)   LMP 12/14/2014   SpO2 100%   BMI 27.34 kg/m   Wt Readings from Last 3 Encounters:  12/20/22 148 lb 3.2 oz (67.2 kg)  01/05/22 161 lb (73 kg)  12/09/20 177 lb 8 oz (80.5 kg)    General: Appears her stated age, overweight, in NAD. Skin: Warm, dry and intact. No rashes, lesions or ulcerations noted. HEENT: Head: normal shape and size; Eyes: sclera white, no icterus, conjunctiva pink, PERRLA and EOMs intact;  Neck:  Neck supple, trachea midline. No masses, lumps or thyromegaly present.  Cardiovascular: Normal rate and rhythm. S1,S2 noted.  No murmur, rubs or gallops noted. No JVD or BLE edema. No carotid bruits noted. Pulmonary/Chest: Normal effort and positive vesicular breath sounds. No respiratory distress. No wheezes, rales or ronchi noted.  Abdomen: Normal bowel sounds.  No CVA tenderness noted. Musculoskeletal: Strength 5/5 BUE/BLE.  No difficulty with gait.  Neurological: Alert and oriented. Cranial nerves II-XII grossly intact. Coordination normal.  Psychiatric: Mood and affect normal. Behavior is normal. Judgment and thought content normal.    BMET    Component Value Date/Time   NA 144 12/20/2022 0850   NA 140 04/28/2014 0753   K 4.3 12/20/2022 0850   K 4.1 04/28/2014 0753   CL 107 (H) 12/20/2022 0850   CL 107 04/28/2014 0753   CO2 23 12/20/2022 0850   CO2 28 04/28/2014 0753   GLUCOSE 77 12/20/2022 0850   GLUCOSE 102 (H) 12/17/2014 1336   GLUCOSE 90 04/28/2014 0753   BUN 10 12/20/2022 0850   BUN 9 04/28/2014 0753   CREATININE 0.78 12/20/2022 0850   CREATININE 0.87 04/28/2014 0753   CALCIUM 10.0 12/20/2022 0850   CALCIUM 9.0 04/28/2014 0753   GFRNONAA 82  11/14/2018 0000   GFRNONAA >60 04/28/2014 0753   GFRNONAA >60 08/14/2012 0901   GFRAA 94 11/14/2018 0000   GFRAA >60 04/28/2014 0753   GFRAA >60 08/14/2012 0901    Lipid Panel     Component Value Date/Time   CHOL 186 12/20/2022 0850   TRIG 89 12/20/2022 0850   HDL 57 12/20/2022 0850   CHOLHDL 3.3 12/20/2022 0850   LDLCALC 113 (H) 12/20/2022 0850    CBC    Component Value Date/Time   WBC 4.1 12/20/2022 0850   WBC 12.1 (H) 12/23/2014 0536   RBC 4.52 12/20/2022 0850   RBC 3.87 12/23/2014 0536   HGB 13.4 12/20/2022 0850   HCT 41.9 12/20/2022 0850   PLT 228 12/20/2022 0850   MCV 93 12/20/2022 0850  MCV 89 04/28/2014 0753   MCH 29.6 12/20/2022 0850   MCH 29.1 12/23/2014 0536   MCHC 32.0 12/20/2022 0850   MCHC 33.1 12/23/2014 0536   RDW 12.9 12/20/2022 0850   RDW 13.1 04/28/2014 0753   LYMPHSABS 1.6 11/14/2018 0000   LYMPHSABS 1.7 04/28/2014 0753   MONOABS 0.6 04/28/2014 0753   EOSABS 0.2 11/14/2018 0000   EOSABS 0.2 04/28/2014 0753   BASOSABS 0.1 11/14/2018 0000   BASOSABS 0.1 04/28/2014 0753    Hgb A1C Lab Results  Component Value Date   HGBA1C 4.9 12/20/2022            Assessment & Plan:   Preventative health maintenance:  Encouraged her to get a flu shot in the fall Tetanus declined Encouraged her to get her COVID booster Discussed Shingrix vaccine, she will check coverage with her insurance company and schedule visit if she would like to have this done She no longer needs to screen for cervical cancer Mammogram ordered-she will call to schedule Cologuard ordered Encouraged her to consume a balanced diet and exercise regimen Advised her to see an eye doctor and dentist annually We will check CBC, c-Met, lipid, A1c today  Urinary frequency, low back pain:  Urinalysis normal Likely muscular strain Encouraged regular stretching, heat and massage  RTC in 1 year, sooner if needed Angeline Laura, NP

## 2023-12-29 NOTE — Assessment & Plan Note (Signed)
 Encouraged diet and exercise for weight loss ?

## 2023-12-30 LAB — CBC
HCT: 45.6 % — ABNORMAL HIGH (ref 35.0–45.0)
Hemoglobin: 14.8 g/dL (ref 11.7–15.5)
MCH: 30 pg (ref 27.0–33.0)
MCHC: 32.5 g/dL (ref 32.0–36.0)
MCV: 92.5 fL (ref 80.0–100.0)
MPV: 10 fL (ref 7.5–12.5)
Platelets: 274 Thousand/uL (ref 140–400)
RBC: 4.93 Million/uL (ref 3.80–5.10)
RDW: 12.9 % (ref 11.0–15.0)
WBC: 4.3 Thousand/uL (ref 3.8–10.8)

## 2023-12-30 LAB — COMPREHENSIVE METABOLIC PANEL WITH GFR
AG Ratio: 2 (calc) (ref 1.0–2.5)
ALT: 16 U/L (ref 6–29)
AST: 22 U/L (ref 10–35)
Albumin: 4.7 g/dL (ref 3.6–5.1)
Alkaline phosphatase (APISO): 52 U/L (ref 37–153)
BUN: 11 mg/dL (ref 7–25)
CO2: 27 mmol/L (ref 20–32)
Calcium: 10 mg/dL (ref 8.6–10.4)
Chloride: 106 mmol/L (ref 98–110)
Creat: 0.8 mg/dL (ref 0.50–1.03)
Globulin: 2.4 g/dL (ref 1.9–3.7)
Glucose, Bld: 80 mg/dL (ref 65–99)
Potassium: 4.7 mmol/L (ref 3.5–5.3)
Sodium: 140 mmol/L (ref 135–146)
Total Bilirubin: 0.6 mg/dL (ref 0.2–1.2)
Total Protein: 7.1 g/dL (ref 6.1–8.1)
eGFR: 89 mL/min/1.73m2 (ref >=60)

## 2023-12-30 LAB — LIPID PANEL
Cholesterol: 212 mg/dL — ABNORMAL HIGH (ref <200)
HDL: 72 mg/dL (ref >=50)
LDL Cholesterol (Calc): 122 mg/dL — ABNORMAL HIGH
Non-HDL Cholesterol (Calc): 140 mg/dL — ABNORMAL HIGH (ref <130)
Total CHOL/HDL Ratio: 2.9 (calc) (ref <5.0)
Triglycerides: 81 mg/dL (ref <150)

## 2023-12-30 LAB — HEMOGLOBIN A1C
Hgb A1c MFr Bld: 4.9 % (ref <5.7)
Mean Plasma Glucose: 94 mg/dL
eAG (mmol/L): 5.2 mmol/L

## 2023-12-30 LAB — TSH: TSH: 1.48 m[IU]/L

## 2024-01-01 ENCOUNTER — Ambulatory Visit: Payer: Self-pay | Admitting: Internal Medicine

## 2024-01-10 DIAGNOSIS — H524 Presbyopia: Secondary | ICD-10-CM | POA: Diagnosis not present

## 2024-01-12 ENCOUNTER — Encounter: Payer: Self-pay | Admitting: Internal Medicine

## 2024-01-13 DIAGNOSIS — Z1211 Encounter for screening for malignant neoplasm of colon: Secondary | ICD-10-CM | POA: Diagnosis not present

## 2024-01-15 ENCOUNTER — Other Ambulatory Visit: Payer: Self-pay

## 2024-01-15 MED ORDER — TRETINOIN 0.025 % EX GEL
Freq: Every day | CUTANEOUS | 0 refills | Status: DC
  Filled 2024-01-15: qty 45, 30d supply, fill #0
  Filled 2024-01-16: qty 45, 45d supply, fill #0

## 2024-01-15 NOTE — Addendum Note (Signed)
 Addended by: ANTONETTE ANGELINE ORN on: 01/15/2024 09:14 AM   Modules accepted: Orders

## 2024-01-16 ENCOUNTER — Other Ambulatory Visit: Payer: Self-pay

## 2024-01-17 ENCOUNTER — Other Ambulatory Visit: Payer: Self-pay

## 2024-01-19 ENCOUNTER — Other Ambulatory Visit: Payer: Self-pay

## 2024-01-19 MED ORDER — TRIAMCINOLONE ACETONIDE 0.1 % EX CREA
TOPICAL_CREAM | Freq: Two times a day (BID) | CUTANEOUS | 1 refills | Status: AC
  Filled 2024-01-19: qty 30, 15d supply, fill #0

## 2024-01-20 LAB — COLOGUARD: COLOGUARD: NEGATIVE

## 2024-01-23 ENCOUNTER — Ambulatory Visit
Admission: RE | Admit: 2024-01-23 | Discharge: 2024-01-23 | Disposition: A | Discharge status: Home / Self Care | Source: Ambulatory Visit | Attending: Internal Medicine | Admitting: Internal Medicine

## 2024-01-23 DIAGNOSIS — Z1231 Encounter for screening mammogram for malignant neoplasm of breast: Secondary | ICD-10-CM | POA: Insufficient documentation

## 2024-03-20 ENCOUNTER — Other Ambulatory Visit: Payer: Self-pay | Admitting: Internal Medicine

## 2024-03-21 ENCOUNTER — Other Ambulatory Visit: Payer: Self-pay

## 2024-03-21 ENCOUNTER — Other Ambulatory Visit: Payer: Self-pay | Admitting: Internal Medicine

## 2024-03-23 ENCOUNTER — Other Ambulatory Visit: Payer: Self-pay

## 2024-03-23 MED FILL — Tretinoin Gel 0.025%: CUTANEOUS | 60 days supply | Qty: 45 | Fill #0 | Status: CN

## 2024-03-23 NOTE — Telephone Encounter (Signed)
 Requested Prescriptions  Pending Prescriptions Disp Refills   RETIN-A  0.025 % gel [Pharmacy Med Name: tretinoin  (RETIN-A ) 0.025 % gel] 45 g 0    Sig: Apply topically at bedtime.     Dermatology:  Acne preparations Passed - 03/23/2024 11:09 AM      Passed - Valid encounter within last 12 months    Recent Outpatient Visits           2 months ago Encounter for general adult medical examination with abnormal findings   [facility] [place], Angeline ORN, NP

## 2024-03-25 ENCOUNTER — Other Ambulatory Visit (HOSPITAL_BASED_OUTPATIENT_CLINIC_OR_DEPARTMENT_OTHER): Payer: Self-pay

## 2024-03-25 ENCOUNTER — Other Ambulatory Visit: Payer: Self-pay

## 2024-03-25 MED FILL — Tretinoin Gel 0.025%: CUTANEOUS | 60 days supply | Qty: 45 | Fill #0 | Status: CN

## 2024-03-26 ENCOUNTER — Other Ambulatory Visit: Payer: Self-pay

## 2024-04-08 ENCOUNTER — Other Ambulatory Visit: Payer: Self-pay

## 2024-06-04 ENCOUNTER — Other Ambulatory Visit: Payer: Self-pay

## 2024-06-04 MED ORDER — ONDANSETRON 4 MG PO TBDP
4.0000 mg | ORAL_TABLET | Freq: Four times a day (QID) | ORAL | 0 refills | Status: AC | PRN
  Filled 2024-06-04: qty 12, 3d supply, fill #0

## 2024-06-04 MED ORDER — CEPHALEXIN 500 MG PO CAPS
500.0000 mg | ORAL_CAPSULE | Freq: Three times a day (TID) | ORAL | 0 refills | Status: AC
  Filled 2024-06-04: qty 15, 5d supply, fill #0

## 2024-06-06 ENCOUNTER — Other Ambulatory Visit: Payer: Self-pay

## 2024-06-06 MED ORDER — OXYCODONE HCL 5 MG PO TABS
5.0000 mg | ORAL_TABLET | ORAL | 0 refills | Status: AC | PRN
  Filled 2024-06-06: qty 20, 4d supply, fill #0

## 2024-06-07 ENCOUNTER — Other Ambulatory Visit: Payer: Self-pay
